# Patient Record
Sex: Female | Born: 1985 | Race: Black or African American | Hispanic: No | Marital: Single | Smoking: Former smoker
Health system: Southern US, Community
[De-identification: ages and names within clinical notes are randomized; demographics above are authoritative.]

## PROBLEM LIST (undated history)

## (undated) DIAGNOSIS — D649 Anemia, unspecified: Secondary | ICD-10-CM

## (undated) DIAGNOSIS — E669 Obesity, unspecified: Secondary | ICD-10-CM

## (undated) HISTORY — PX: APPENDECTOMY: SHX54

---

## 2004-10-08 ENCOUNTER — Emergency Department (HOSPITAL_COMMUNITY): Admission: EM | Admit: 2004-10-08 | Discharge: 2004-10-08 | Payer: Self-pay | Admitting: Emergency Medicine

## 2011-04-16 ENCOUNTER — Encounter: Payer: Self-pay | Admitting: Emergency Medicine

## 2011-04-16 ENCOUNTER — Emergency Department (HOSPITAL_BASED_OUTPATIENT_CLINIC_OR_DEPARTMENT_OTHER)
Admission: EM | Admit: 2011-04-16 | Discharge: 2011-04-16 | Disposition: A | Discharge status: Home / Self Care | Payer: Medicaid Other | Attending: Emergency Medicine | Admitting: Emergency Medicine

## 2011-04-16 DIAGNOSIS — J020 Streptococcal pharyngitis: Secondary | ICD-10-CM | POA: Insufficient documentation

## 2011-04-16 DIAGNOSIS — F17200 Nicotine dependence, unspecified, uncomplicated: Secondary | ICD-10-CM | POA: Insufficient documentation

## 2011-04-16 DIAGNOSIS — R042 Hemoptysis: Secondary | ICD-10-CM | POA: Insufficient documentation

## 2011-04-16 LAB — PREGNANCY, URINE: Preg Test, Ur: NEGATIVE

## 2011-04-16 LAB — RAPID STREP SCREEN (MED CTR MEBANE ONLY): Streptococcus, Group A Screen (Direct): POSITIVE — AB

## 2011-04-16 MED ORDER — PENICILLIN G BENZATHINE 1200000 UNIT/2ML IM SUSP
1.2000 10*6.[IU] | Freq: Once | INTRAMUSCULAR | Status: AC
  Administered 2011-04-16: 1.2 10*6.[IU] via INTRAMUSCULAR
  Filled 2011-04-16: qty 2

## 2011-04-16 NOTE — ED Notes (Signed)
Pt c/o sore throat x 1 week worsening tonight. Pt reports hemoptysis.

## 2011-04-16 NOTE — ED Provider Notes (Signed)
History     CSN: [id number]  Arrival date & time 04/16/11  4098   First MD Initiated Contact with Patient 04/16/11 [phone]      Chief Complaint  Patient presents with  . Sore Throat  . Hemoptysis    (Consider location/radiation/quality/duration/timing/severity/associated sxs/prior treatment) Patient is a 25 y.o. female presenting with pharyngitis. The history is provided by the patient. No language interpreter was used.  Sore Throat This is a recurrent problem. The current episode started more than 1 week ago. The problem occurs constantly. The problem has not changed since onset.Pertinent negatives include no chest pain, no abdominal pain, no headaches and no shortness of breath. The symptoms are aggravated by swallowing. The symptoms are relieved by nothing. She has tried nothing for the symptoms. The treatment provided no relief.  Had tonsillitis last year at the same time.  Thinks shes pregnant LMP 03/18/11  History reviewed. No pertinent past medical history.  Past Surgical History  Procedure Date  . Appendectomy     No family history on file.  History  Substance Use Topics  . Smoking status: Current Some Day Smoker  . Smokeless tobacco: Not on file  . Alcohol Use: Yes    OB History    Grav Para Term Preterm Abortions TAB SAB Ect Mult Living                  Review of Systems  Constitutional: Negative for fever, activity change and fatigue.  HENT: Positive for sore throat. Negative for sneezing and trouble swallowing.   Eyes: Negative for discharge.  Respiratory: Negative for shortness of breath.   Cardiovascular: Negative for chest pain.  Gastrointestinal: Negative for nausea, vomiting and abdominal pain.  Genitourinary: Negative for vaginal bleeding, vaginal discharge and difficulty urinating.  Musculoskeletal: Negative.   Skin: Negative.   Neurological: Negative for headaches.  Hematological: Negative for adenopathy.  Psychiatric/Behavioral: Negative.      Allergies  Review of patient's allergies indicates no known allergies.  Home Medications  No current outpatient prescriptions on file.  BP 129/73  Pulse 88  Temp(Src) 98.9 F (37.2 C) (Oral)  Resp 18  SpO2 99%  Physical Exam  Constitutional: She is oriented to person, place, and time. She appears well-developed and well-nourished. No distress.  HENT:  Head: Normocephalic and atraumatic.  Right Ear: Tympanic membrane is not injected.  Left Ear: Tympanic membrane is not injected.  Mouth/Throat: Uvula is midline. Oropharyngeal exudate and posterior oropharyngeal erythema present. No tonsillar abscesses.  Eyes: EOM are normal. Pupils are equal, round, and reactive to light.  Neck: Normal range of motion. Neck supple. No tracheal deviation present.  Cardiovascular: Normal rate and regular rhythm.   Pulmonary/Chest: Effort normal and breath sounds normal. She has no wheezes. She has no rales.  Abdominal: Soft. Bowel sounds are normal. She exhibits no mass. There is no tenderness. There is no rebound and no guarding.  Musculoskeletal: Normal range of motion.  Lymphadenopathy:    She has no cervical adenopathy.  Neurological: She is alert and oriented to person, place, and time.  Skin: Skin is warm and dry.  Psychiatric: Thought content normal.    ED Course  Procedures (including critical care time)  Labs Reviewed  RAPID STREP SCREEN - Abnormal; Notable for the following:    Streptococcus, Group A Screen (Direct) POSITIVE (*)    All other components within normal limits  PREGNANCY, URINE   No results found.   1. Strep pharyngitis  MDM  Follow up with ENT for consultation for tonsil removal.  If you are concerned you might be pregnant continue to check pregnancy tests at home or through your doctor because it may be too soon for a urine test to detect pregnancy. Patient verbalizes understanding and agrees to follow up       Jemia Fata Smitty Cords,  MD 04/16/11 [phone]

## 2013-11-22 ENCOUNTER — Emergency Department (HOSPITAL_COMMUNITY)
Admission: EM | Admit: 2013-11-22 | Discharge: 2013-11-22 | Disposition: A | Discharge status: Home / Self Care | Payer: Medicaid Other | Attending: Emergency Medicine | Admitting: Emergency Medicine

## 2013-11-22 ENCOUNTER — Encounter (HOSPITAL_COMMUNITY): Payer: Self-pay | Admitting: Emergency Medicine

## 2013-11-22 DIAGNOSIS — J020 Streptococcal pharyngitis: Secondary | ICD-10-CM | POA: Diagnosis not present

## 2013-11-22 DIAGNOSIS — J029 Acute pharyngitis, unspecified: Secondary | ICD-10-CM | POA: Diagnosis present

## 2013-11-22 DIAGNOSIS — J36 Peritonsillar abscess: Secondary | ICD-10-CM | POA: Diagnosis not present

## 2013-11-22 DIAGNOSIS — Z87891 Personal history of nicotine dependence: Secondary | ICD-10-CM | POA: Diagnosis not present

## 2013-11-22 LAB — BASIC METABOLIC PANEL
Anion gap: 14 (ref 5–15)
BUN: 9 mg/dL (ref 6–23)
CALCIUM: 8.8 mg/dL (ref 8.4–10.5)
CO2: 24 meq/L (ref 19–32)
Chloride: 103 mEq/L (ref 96–112)
Creatinine, Ser: 0.76 mg/dL (ref 0.50–1.10)
GFR calc Af Amer: >90 mL/min (ref >90)
GFR calc non Af Amer: >90 mL/min (ref >90)
Glucose, Bld: 89 mg/dL (ref 70–99)
POTASSIUM: 3.6 meq/L — AB (ref 3.7–5.3)
Sodium: 141 mEq/L (ref 137–147)

## 2013-11-22 LAB — CBC WITH DIFFERENTIAL/PLATELET
BASOS ABS: 0 10*3/uL (ref 0.0–0.1)
Basophils Relative: 0 % (ref 0–1)
Eosinophils Absolute: 0.1 10*3/uL (ref 0.0–0.7)
Eosinophils Relative: 1 % (ref 0–5)
HEMATOCRIT: 35 % — AB (ref 36.0–46.0)
HEMOGLOBIN: 10.9 g/dL — AB (ref 12.0–15.0)
LYMPHS ABS: 2.9 10*3/uL (ref 0.7–4.0)
Lymphocytes Relative: 27 % (ref 12–46)
MCH: 25.4 pg — ABNORMAL LOW (ref 26.0–34.0)
MCHC: 31.1 g/dL (ref 30.0–36.0)
MCV: 81.6 fL (ref 78.0–100.0)
MONO ABS: 0.6 10*3/uL (ref 0.1–1.0)
MONOS PCT: 5 % (ref 3–12)
Neutro Abs: 7.3 10*3/uL (ref 1.7–7.7)
Neutrophils Relative %: 67 % (ref 43–77)
Platelets: 267 10*3/uL (ref 150–400)
RBC: 4.29 MIL/uL (ref 3.87–5.11)
RDW: 15 % (ref 11.5–15.5)
WBC: 11 10*3/uL — AB (ref 4.0–10.5)

## 2013-11-22 LAB — RAPID STREP SCREEN (MED CTR MEBANE ONLY): STREPTOCOCCUS, GROUP A SCREEN (DIRECT): POSITIVE — AB

## 2013-11-22 MED ORDER — DEXAMETHASONE SODIUM PHOSPHATE 10 MG/ML IJ SOLN
8.0000 mg | Freq: Once | INTRAMUSCULAR | Status: AC
  Administered 2013-11-22: 8 mg via INTRAVENOUS
  Filled 2013-11-22: qty 1

## 2013-11-22 MED ORDER — BENZOCAINE 20 % MT SOLN
Freq: Once | OROMUCOSAL | Status: AC
  Administered 2013-11-22: 1 via OROMUCOSAL
  Filled 2013-11-22: qty 57

## 2013-11-22 MED ORDER — SODIUM CHLORIDE 0.9 % IV BOLUS (SEPSIS)
1000.0000 mL | Freq: Once | INTRAVENOUS | Status: AC
  Administered 2013-11-22: 1000 mL via INTRAVENOUS

## 2013-11-22 MED ORDER — LIDOCAINE HCL (PF) 1 % IJ SOLN
3.0000 mL | Freq: Once | INTRAMUSCULAR | Status: AC
  Administered 2013-11-22 (×2): 3 mL
  Filled 2013-11-22: qty 5

## 2013-11-22 MED ORDER — FENTANYL CITRATE 0.05 MG/ML IJ SOLN
75.0000 ug | Freq: Once | INTRAMUSCULAR | Status: AC
  Administered 2013-11-22: 75 ug via INTRAVENOUS
  Filled 2013-11-22: qty 2

## 2013-11-22 MED ORDER — CLINDAMYCIN PHOSPHATE 600 MG/50ML IV SOLN
600.0000 mg | Freq: Once | INTRAVENOUS | Status: AC
  Administered 2013-11-22: 600 mg via INTRAVENOUS
  Filled 2013-11-22: qty 50

## 2013-11-22 MED ORDER — CLINDAMYCIN HCL 150 MG PO CAPS: 300.0000 mg | ORAL_CAPSULE | Freq: Three times a day (TID) | ORAL | Status: DC

## 2013-11-22 MED ORDER — LIDOCAINE HCL (PF) 1 % IJ SOLN
INTRAMUSCULAR | Status: AC
Start: 2013-11-22 — End: 2013-11-22
  Administered 2013-11-22: 3 mL
  Filled 2013-11-22: qty 5

## 2013-11-22 NOTE — ED Notes (Signed)
CT scan note: Exam on hold, patient to have another test in its place. RN will call if still needed

## 2013-11-22 NOTE — ED Notes (Signed)
Pt alert and oriented at discharge.  Pt ambulated with steady gait with the RN to the waiting room.

## 2013-11-22 NOTE — Discharge Instructions (Signed)
If you were given medicines take as directed.  If you are on coumadin or contraceptives realize their levels and effectiveness is altered by many different medicines.  If you have any reaction (rash, tongues swelling, other) to the medicines stop taking and see a physician.   Please follow up as directed and return to the ER or see a physician for new or worsening symptoms.  Thank you. Filed Vitals:   11/22/13 0830 11/22/13 0845 11/22/13 0847 11/22/13 0907  BP: 131/66 117/49  133/70  Pulse: 68 59  56  Temp:      TempSrc:      Resp:    18  Height:      Weight:      SpO2: 100% 100% 100% 100%

## 2013-11-22 NOTE — ED Notes (Signed)
Pt c/o of sore throat that has been ongoing since Monday.  Pt tonsils on assessment are swollen and red.

## 2013-11-22 NOTE — Progress Notes (Signed)
Second Lidocaine neb administered per MD to prep for procedure. Pt tolerating well. RT will continue to monitor.

## 2013-11-22 NOTE — ED Provider Notes (Signed)
CSN: [id number]     Arrival date & time 11/22/13  0504 History   First MD Initiated Contact with Patient 11/22/13 0630     Chief Complaint  Patient presents with  . Sore Throat     (Consider location/radiation/quality/duration/timing/severity/associated sxs/prior Treatment) HPI Comments: 28 year old female with past smoking history, appendectomy, otherwise healthy with no immunosuppression history presents with worsening sore throat since Monday. Patient was seen at outside hospital given Decadron and antibiotic prescription for which he cannot afford to fill. Patient's had worsening symptoms since and mild voice change. No abscess history. Pain with swallowing and decreased oral intake.  Patient is a 28 y.o. female presenting with pharyngitis. The history is provided by the patient.  Sore Throat Pertinent negatives include no chest pain, no abdominal pain, no headaches and no shortness of breath.    History reviewed. No pertinent past medical history. Past Surgical History  Procedure Laterality Date  . Appendectomy     No family history on file. History  Substance Use Topics  . Smoking status: Former Games developer  . Smokeless tobacco: Not on file  . Alcohol Use: No   OB History   Grav Para Term Preterm Abortions TAB SAB Ect Mult Living                 Review of Systems  Constitutional: Positive for appetite change. Negative for fever and chills.  HENT: Positive for sore throat. Negative for congestion.   Eyes: Negative for visual disturbance.  Respiratory: Negative for shortness of breath.   Cardiovascular: Negative for chest pain.  Gastrointestinal: Negative for vomiting and abdominal pain.  Genitourinary: Negative for dysuria and flank pain.  Musculoskeletal: Negative for back pain, neck pain and neck stiffness.  Skin: Negative for rash.  Neurological: Negative for light-headedness and headaches.      Allergies  Review of patient's allergies indicates no known  allergies.  Home Medications   Prior to Admission medications   Medication Sig Start Date End Date Taking? Authorizing Provider  naproxen sodium (ANAPROX) 220 MG tablet Take 220 mg by mouth every 8 (eight) hours as needed (pain).   Yes Historical Provider, MD   BP 133/70  Pulse 56  Temp(Src) 98.7 F (37.1 C) (Oral)  Resp 18  Ht 5\' 4"  (1.626 m)  Wt 245 lb (111.131 kg)  BMI 42.03 kg/m2  SpO2 100%  LMP 11/09/2013 Physical Exam  Nursing note and vitals reviewed. Constitutional: She is oriented to person, place, and time. She appears well-developed and well-nourished.  HENT:  Head: Normocephalic and atraumatic.  Patient has mild erythema bilateral posterior pharynx with prominent/bulging right supratonsillar region, mild change in voice, no stridor, no swelling submandibularly, neck supple, no difficulty with speech.  Eyes: Conjunctivae are normal. Right eye exhibits no discharge. Left eye exhibits no discharge.  Neck: Normal range of motion. Neck supple. No tracheal deviation present.  Cardiovascular: Normal rate and regular rhythm.   Pulmonary/Chest: Effort normal and breath sounds normal.  Abdominal: Soft. She exhibits no distension. There is no tenderness. There is no guarding.  Musculoskeletal: She exhibits no edema.  Neurological: She is alert and oriented to person, place, and time.  Skin: Skin is warm. No rash noted.  Psychiatric: She has a normal mood and affect.    ED Course  Procedures (including critical care time) Peritonsillar abscess drainage INCISION AND DRAINAGE Performed by: Enid Skeens Consent: Verbal consent obtained. Risks and benefits: risks, benefits and alternatives were discussed Type: Peritonsillar abscess  Body area: Right  posterior pharynx  Anesthesia: Lidocaine nebulizer, local Hurricaine spray Incision was made with 18-gauge needle with 1 cm safety cut off the plastic covering.  Local anesthetic: lidocaine Anesthetic total: 6  ml Complexity:  BComplex blunt dissection to break up loculations Drainage: 3 cc purulent drainage   Patient tolerance: Patient tolerated the procedure well with no immediate complications.  EMERGENCY DEPARTMENT US SOFT TISSUE INTERPRETATION "Study: Limited Soft Tissue Ultrasound"  INDICATIONS: Pain and Soft tissue infection Multiple views of the body part were obtained in real-time with a multi-frequency linear probe PERFORMED BY:  Myself IMAGES ARCHIVED?: Yes SIDE:Right  BODY PART:Neck posterior pharynx PTA FINDINGS: Abcess present INTERPRETATION:  Abcess present    Labs Review Labs Reviewed  RAPID STREP SCREEN - Abnormal; Notable for the following:    Streptococcus, Group A Screen (Direct) POSITIVE (*)    All other components within normal limits  CBC WITH DIFFERENTIAL - Abnormal; Notable for the following:    WBC 11.0 (*)    Hemoglobin 10.9 (*)    HCT 35.0 (*)    MCH 25.4 (*)    All other components within normal limits  BASIC METABOLIC PANEL - Abnormal; Notable for the following:    Potassium 3.6 (*)    All other components within normal limits  CULTURE, ROUTINE-ABSCESS    Imaging Review No results found.   EKG Interpretation None      MDM   Final diagnoses:  Peritonsillar abscess  Strep pharyngitis   Clinically peritonsillar abscess. Discussed options including CT versus bedside ultrasound, patient has financial issues and prefers bedside ultrasound and attempted drainage at bedside. Patient giving lidocaine neb and Hurricaine spray at bedside, initially didn't tolerate well as, second lidocaine neb given and patient tolerated well for ultrasound showing abscess followed by 18-gauge needle drainage of 3 cc of pus. Patient felt improved after procedure. Plan for followup with ENT and primary Dr. as needed. Antibiotics for home. Patient received IV antibiotics and fluids in ER. Discussed risks and benefits.  Pt tolerated procedure well, fup outpt  discused Results and differential diagnosis were discussed with the patient/parent/guardian. Close follow up outpatient was discussed, comfortable with the plan.   Medications  clindamycin (CLEOCIN) IVPB 600 mg (600 mg Intravenous New Bag/Given 11/22/13 0804)  lidocaine (PF) (XYLOCAINE) 1 % injection 3 mL (3 mLs Other Given 11/22/13 0846)  sodium chloride 0.9 % bolus 1,000 mL (1,000 mLs Intravenous New Bag/Given 11/22/13 0741)  dexamethasone (DECADRON) injection 8 mg (8 mg Intravenous Given 11/22/13 0746)  fentaNYL (SUBLIMAZE) injection 75 mcg (75 mcg Intravenous Given 11/22/13 0741)  benzocaine (HURRICAINE) 20 % mouth spray (1 application Mouth/Throat Given 11/22/13 0748)    Filed Vitals:   11/22/13 0830 11/22/13 0845 11/22/13 0847 11/22/13 0907  BP: 131/66 117/49  133/70  Pulse: 68 59  56  Temp:      TempSrc:      Resp:    18  Height:      Weight:      SpO2: 100% 100% 100% 100%        Enid SkeensJoshua M Abdulrahman Bracey, MD 11/22/13 [phone]

## 2013-11-24 ENCOUNTER — Telehealth (HOSPITAL_COMMUNITY): Payer: Self-pay

## 2013-11-24 LAB — CULTURE, ROUTINE-ABSCESS

## 2013-11-24 NOTE — ED Notes (Signed)
Post ED Visit - Positive Culture Follow-up  Culture report reviewed by antimicrobial stewardship pharmacist: []  Wes Dulaney, Pharm.D., BCPS []  Celedonio MiyamotoJeremy Frens, Pharm.D., BCPS []  Georgina PillionElizabeth Martin, Pharm.D., BCPS []  MirandaMinh Pham, [address] BoulevardPharm.D., BCPS, AAHIVP []  Estella HuskMichelle Turner, Pharm.D., BCPS, AAHIVP []  Red ChristiansSamson Lee, Pharm.D. [x]  Tennis Mustassie Stewart, Pharm.D.  Positive abscess culture Treated with clindamycin , organism sensitive to the same and no further patient follow-up is required at this time.  Ashley JacobsFesterman, Bless Lisenby C 11/24/2013, 1:51 PM

## 2013-11-25 ENCOUNTER — Telehealth (HOSPITAL_BASED_OUTPATIENT_CLINIC_OR_DEPARTMENT_OTHER): Payer: Self-pay | Admitting: Emergency Medicine

## 2013-11-25 NOTE — Telephone Encounter (Signed)
Post ED Visit - Positive Culture Follow-up  Culture report reviewed by antimicrobial stewardship pharmacist: []  Katherine Lowe, Pharm.D., BCPS []  Katherine Lowe, [address]Pharm[address]D., BCPS []  Katherine Lowe, Pharm.D., BCPS []  Gil[place] CityMinh Lowe, [place]arm[place]D., BCPS, AAHIVP []  Katherine Lowe, Pharm.D., BCPS, AAHIVP [x]  Katherine Lowe, Pharm.D. []  Katherine Lowe, Pharm.D.  Positive abcess culture abundant group A strep isolated Treated with Clindamycin HCL 150 mg po 2 capsules(300 mg) by mouth tid x 7 days, organism sensitive to the same and no further patient follow-up is required at this time.  Katherine Lowe, Katherine Lowe 11/25/2013, 11:15 AM

## 2013-12-02 ENCOUNTER — Emergency Department (HOSPITAL_COMMUNITY): Payer: Medicaid Other

## 2013-12-02 ENCOUNTER — Encounter (HOSPITAL_COMMUNITY): Payer: Self-pay | Admitting: Emergency Medicine

## 2013-12-02 ENCOUNTER — Emergency Department (HOSPITAL_COMMUNITY)
Admission: EM | Admit: 2013-12-02 | Discharge: 2013-12-02 | Disposition: A | Discharge status: Home / Self Care | Payer: Medicaid Other | Attending: Emergency Medicine | Admitting: Emergency Medicine

## 2013-12-02 DIAGNOSIS — Z87891 Personal history of nicotine dependence: Secondary | ICD-10-CM | POA: Insufficient documentation

## 2013-12-02 DIAGNOSIS — J029 Acute pharyngitis, unspecified: Secondary | ICD-10-CM | POA: Diagnosis present

## 2013-12-02 DIAGNOSIS — J36 Peritonsillar abscess: Secondary | ICD-10-CM | POA: Diagnosis not present

## 2013-12-02 LAB — BASIC METABOLIC PANEL
ANION GAP: 12 (ref 5–15)
BUN: 10 mg/dL (ref 6–23)
CALCIUM: 9.2 mg/dL (ref 8.4–10.5)
CO2: 23 meq/L (ref 19–32)
CREATININE: 0.68 mg/dL (ref 0.50–1.10)
Chloride: 105 mEq/L (ref 96–112)
GFR calc Af Amer: >90 mL/min (ref >90)
Glucose, Bld: 95 mg/dL (ref 70–99)
POTASSIUM: 4.1 meq/L (ref 3.7–5.3)
Sodium: 140 mEq/L (ref 137–147)

## 2013-12-02 LAB — CBC WITH DIFFERENTIAL/PLATELET
BASOS ABS: 0 10*3/uL (ref 0.0–0.1)
BASOS PCT: 0 % (ref 0–1)
EOS ABS: 0.1 10*3/uL (ref 0.0–0.7)
EOS PCT: 0 % (ref 0–5)
HEMATOCRIT: 35.5 % — AB (ref 36.0–46.0)
Hemoglobin: 11.5 g/dL — ABNORMAL LOW (ref 12.0–15.0)
LYMPHS ABS: 1.9 10*3/uL (ref 0.7–4.0)
LYMPHS PCT: 12 % (ref 12–46)
MCH: 25.6 pg — AB (ref 26.0–34.0)
MCHC: 32.4 g/dL (ref 30.0–36.0)
MCV: 78.9 fL (ref 78.0–100.0)
MONOS PCT: 5 % (ref 3–12)
Monocytes Absolute: 0.8 10*3/uL (ref 0.1–1.0)
NEUTROS PCT: 83 % — AB (ref 43–77)
Neutro Abs: 12.8 10*3/uL — ABNORMAL HIGH (ref 1.7–7.7)
PLATELETS: 302 10*3/uL (ref 150–400)
RBC: 4.5 MIL/uL (ref 3.87–5.11)
RDW: 15.1 % (ref 11.5–15.5)
WBC: 15.5 10*3/uL — ABNORMAL HIGH (ref 4.0–10.5)

## 2013-12-02 MED ORDER — ONDANSETRON HCL 4 MG/2ML IJ SOLN
4.0000 mg | Freq: Once | INTRAMUSCULAR | Status: AC
  Administered 2013-12-02: 4 mg via INTRAVENOUS
  Filled 2013-12-02: qty 2

## 2013-12-02 MED ORDER — CLINDAMYCIN PHOSPHATE 900 MG/50ML IV SOLN
900.0000 mg | Freq: Once | INTRAVENOUS | Status: AC
  Administered 2013-12-02: 900 mg via INTRAVENOUS
  Filled 2013-12-02: qty 50

## 2013-12-02 MED ORDER — CLINDAMYCIN HCL 150 MG PO CAPS: 150.0000 mg | ORAL_CAPSULE | Freq: Four times a day (QID) | ORAL | Status: DC

## 2013-12-02 MED ORDER — IOHEXOL 300 MG/ML  SOLN
75.0000 mL | Freq: Once | INTRAMUSCULAR | Status: AC | PRN
  Administered 2013-12-02: 75 mL via INTRAVENOUS

## 2013-12-02 MED ORDER — DEXAMETHASONE SODIUM PHOSPHATE 10 MG/ML IJ SOLN
20.0000 mg | Freq: Once | INTRAMUSCULAR | Status: AC
  Administered 2013-12-02: 20 mg via INTRAVENOUS
  Filled 2013-12-02: qty 2

## 2013-12-02 MED ORDER — MORPHINE SULFATE 4 MG/ML IJ SOLN
4.0000 mg | Freq: Once | INTRAMUSCULAR | Status: AC
  Administered 2013-12-02: 4 mg via INTRAVENOUS
  Filled 2013-12-02: qty 1

## 2013-12-02 MED ORDER — HYDROCODONE-ACETAMINOPHEN 5-325 MG PO TABS: 1.0000 | ORAL_TABLET | ORAL | Status: DC | PRN

## 2013-12-02 MED ORDER — FENTANYL CITRATE 0.05 MG/ML IJ SOLN
50.0000 ug | Freq: Once | INTRAMUSCULAR | Status: AC
  Administered 2013-12-02: 50 ug via INTRAVENOUS
  Filled 2013-12-02: qty 2

## 2013-12-02 NOTE — ED Provider Notes (Signed)
Medical screening examination/treatment/procedure(s) were performed by non-physician practitioner and as supervising physician I was immediately available for consultation/collaboration.   EKG Interpretation None       Olivia Mackielga M Zarielle Cea, MD 12/02/13 2103

## 2013-12-02 NOTE — ED Provider Notes (Signed)
CSN: [id number]     Arrival date & time 12/02/13  0446 History   First MD Initiated Contact with Patient 12/02/13 [phone]     Chief Complaint  Patient presents with  . Oral Swelling  . Sore Throat     (Consider location/radiation/quality/duration/timing/severity/associated sxs/prior Treatment) HPI 28 year old female with no past medical history who presents with one day of throat pain and swelling. Patient was seen for similar symptoms approximately 10 days ago and diagnosed with peritonsillar abscess. Patient's abscess was drained at bedside in the ED, and patient was treated with IV antibiotics in the ED and encouraged to follow up with ENT.patient states today that she did not have money to fill her prescription antibiotics, and and was unable to followup with ENT. She states she began having similar symptoms yesterday with a gradual onset of throat pain over the last 12 hours, dysphagia, and came in for reevaluation today. She describes the pain as being constant, is worse with swallowing, has no aggravating or alleviating factors.  She denies fever, nausea, vomiting, shortness of breath, dizziness, chest pain.  History reviewed. No pertinent past medical history. Past Surgical History  Procedure Laterality Date  . Appendectomy     History reviewed. No pertinent family history. History  Substance Use Topics  . Smoking status: Former Games developer  . Smokeless tobacco: Not on file  . Alcohol Use: No   OB History   Grav Para Term Preterm Abortions TAB SAB Ect Mult Living                 Review of Systems  Constitutional: Negative for fever and chills.  HENT: Positive for sore throat, trouble swallowing and voice change. Negative for drooling and hearing loss.   Eyes: Negative for visual disturbance.  Respiratory: Negative for shortness of breath, wheezing and stridor.   Cardiovascular: Negative for chest pain.  Gastrointestinal: Negative for nausea, vomiting, abdominal pain and diarrhea.   Genitourinary: Negative for dysuria.  Musculoskeletal: Negative for arthralgias.  Skin: Negative for rash.  Allergic/Immunologic: Negative for immunocompromised state.  Neurological: Negative for dizziness, syncope, weakness and numbness.  Psychiatric/Behavioral: Negative.       Allergies  Review of patient's allergies indicates no known allergies.  Home Medications   Prior to Admission medications   Medication Sig Start Date End Date Taking? Authorizing Provider  acetaminophen (TYLENOL) 325 MG tablet Take 650 mg by mouth every 6 (six) hours as needed for mild pain.   Yes Historical Provider, MD  naproxen sodium (ANAPROX) 220 MG tablet Take 220 mg by mouth every 8 (eight) hours as needed (pain).   Yes Historical Provider, MD  clindamycin (CLEOCIN) 150 MG capsule Take 2 capsules (300 mg total) by mouth 3 (three) times daily. 11/22/13   Enid Skeens, MD  clindamycin (CLEOCIN) 150 MG capsule Take 1 capsule (150 mg total) by mouth every 6 (six) hours. 12/02/13   Rolland Porter, MD  HYDROcodone-acetaminophen (NORCO/VICODIN) 5-325 MG per tablet Take 1 tablet by mouth every 4 (four) hours as needed. 12/02/13   Rolland Porter, MD   BP 127/68  Pulse 70  Temp(Src) 98.7 F (37.1 C) (Oral)  Resp 18  Ht 5\' 4"  (1.626 m)  Wt 245 lb (111.131 kg)  BMI 42.03 kg/m2  SpO2 96%  LMP 11/09/2013 Physical Exam  Constitutional: She is oriented to person, place, and time. She appears well-developed and well-nourished. No distress.  HENT:  Head: Normocephalic and atraumatic.  Right Ear: Tympanic membrane normal.  Left Ear: Tympanic membrane  normal.  Mouth/Throat: No trismus in the jaw. No uvula swelling. Posterior oropharyngeal edema, posterior oropharyngeal erythema and tonsillar abscesses present. No oropharyngeal exudate.  No trismus noted on exam. Swelling noted to right tonsil to the extent that it is touching the left tonsil. No obvious exudate noted. No stridor noted  Neck: Normal range of motion.  Tracheal tenderness present.  Cardiovascular: Normal rate, regular rhythm, S1 normal and S2 normal.   Pulmonary/Chest: Effort normal and breath sounds normal. No accessory muscle usage or stridor. Not tachypneic. No respiratory distress.  Abdominal: Normal appearance. There is no tenderness.  Neurological: She is alert and oriented to person, place, and time. She has normal strength. No cranial nerve deficit or sensory deficit.  Skin: She is not diaphoretic.  Psychiatric: She has a normal mood and affect.    ED Course  Procedures (including critical care time) Labs Review Labs Reviewed  CBC WITH DIFFERENTIAL - Abnormal; Notable for the following:    WBC 15.5 (*)    Hemoglobin 11.5 (*)    HCT 35.5 (*)    MCH 25.6 (*)    Neutrophils Relative % 83 (*)    Neutro Abs 12.8 (*)    All other components within normal limits  BASIC METABOLIC PANEL    Imaging Review Ct Soft Tissue Neck W Contrast  12/02/2013   CLINICAL DATA:  History of recent peritonsillar abscess treated with surgical drainage but no antibiotics  EXAM: CT NECK WITH CONTRAST  TECHNIQUE: Multidetector CT imaging of the neck was performed using the standard protocol following the bolus administration of intravenous contrast.  CONTRAST:  75mL OMNIPAQUE IOHEXOL 300 MG/ML  SOLN intravenously  COMPARISON:  None.  FINDINGS: There is a 2.2 x 2.4 x 2.4 cm low-density region in the right parapharyngeal space with surrounding enhancement consistent with peritonsillar abscess. This is producing significant mass effect upon the posterior nasopharygeal and upper oropharyngeal airway. There is no abnormality on the right. The prevertebral soft tissue spaces are normal. There are enlarged anterior cervical lymph nodes.  The parotid and submandibular glands are unremarkable. The paranasal sinuses exhibit no acute inflammatory changes. The carotid and jugular vessels are normal. The laryngeal structures and the thyroid gland are unremarkable. The  pulmonary apices are clear. There is reversal of the normal cervical lordosis consistent with muscle spasm.  IMPRESSION: There is a large right-sided peritonsillar abscess producing some compromise of the nasopharygeal and upper oropharyngeal airways. There is no significant inflammatory change elsewhere in the neck.  These results were called by telephone at the time of interpretation on 12/02/2013 at 8:21 am to [facility] CenterJOSEPH Lasonja Lakins, PA, who verbally acknowledged these results.   Electronically Signed   By: David  [place]   On: 12/02/2013 08:22     EKG Interpretation None      MDM   Final diagnoses:  Peritonsillar abscess    28 year old female treated 10 days ago for PTA in the ED with IV antibiotics and bedside drainage. Today back with the same symptoms of sore throat, dysphagia. Patient has swelling of right tonsil with uvula deviation. No fever, chills, nausea, vomiting, shortness of breath, stridor, chest pain.    10:00 AM: Patient states final did not help her pain. Will try morphine 4 mg for pain management.  ENT consulted. Dr.Teoh spoken with and recommends giving patient clindamycin, Decadron and wishes to see patient in the ED to drain abscess. Patient complaining of having oral secretions, we have her suction as needed for secretions.  11:00 AM: Dr.  Teoh seen patient in the ED and performed I&D on peritonsillar abscess. Dr. Suszanne Conners recommends by mouth clindamycin 150 3 times a day for 10 days and close followup with ENT. We discussed with patient that clindamycin will be available on [facility] $4 list and strongly encouraged patient to fill this prescription and follow up with ENT closely. We encouraged patient to call or return to the ER should she have any questions or concerns.   Signed,  Ladona Mow, PA-C 6:20 PM   .  Monte Fantasia, PA-C 12/02/13 Zollie Pee

## 2013-12-02 NOTE — Discharge Instructions (Signed)
Peritonsillar Abscess Peritonsillar abscess is a collection of yellowish white fluid (pus) in the back of the throat. This fluid forms behind the tonsils. The treatment is most often drainage. This is done by:  Putting a needle into the abscess.  Cutting and draining the abscess. HOME CARE  If your abscess was drained today:  Mix 1 teaspoon of salt in 8 ounces of warm water for gargling.  Gargle the warm salt water.  Gargle 4 times per day or as needed for comfort. Do not swallow this mixture.  Rest in bed as needed.  Return to normal activity as soon as you can.  Apply cold to your neck for pain relief.  Put ice in a plastic bag.  Place a towel between your skin and the bag.  Leave the ice on for 15-20 minutes at a time, 03-04 times a day.  Eat a soft or liquid diet. Drink cold fluids. Cold fluids will soothe and take puffiness (swelling) down.  Only take medicine as told by your doctor.  Take all medicine as told. GET HELP RIGHT AWAY IF:   You are coughing up or throwing up (vomiting) blood.  Your throat pain is severe and pain medicine does not help it.  You have trouble talking or breathing.  You have trouble swallowing or eating.  You find it easier to breathe while leaning forward.  You have pain that gets worse.  You have pain, puffiness, redness, or drainage in your throat.  You have a fever.  You become dizzy, have a headache, have low energy, or feel sick.  You have signs of body fluid loss (dehydration). This includes lightheadedness when standing, peeing (urinating) less, a fast heart rate, or dry mouth and nose.  You start to drool. MAKE SURE YOU:  Understand these instructions.  Will watch your condition.  Will get help if you are not doing well or get worse. Document Released: 03/22/2009 Document Revised: 06/26/2011 Document Reviewed: 03/22/2009 [facility] Patient Information 2015 [place], [place]. This information is not intended to replace  advice given to you by your health care provider. Make sure you discuss any questions you have with your health care provider.  Peritonsillar Abscess A peritonsillar abscess is a collection of pus located in the back of the throat behind the tonsils. It usually occurs when a streptococcal infection of the throat or tonsils spreads into the space around the tonsils. They are almost always caused by the streptococcal germ (bacteria). The treatment of a peritonsillar abscess is most often drainage accomplished by putting a needle into the abscess or cutting (incising) and draining the abscess. This is most often followed with a course of antibiotics. HOME CARE INSTRUCTIONS  If your abscess was drained by your caregiver today, rinse your throat (gargle) with warm salt water four times per day or as needed for comfort. Do not swallow this mixture. Mix 1 teaspoon of salt in 8 ounces of warm water for gargling.  Rest in bed as needed. Resume activities as able.  Apply cold to your neck for pain relief. Fill a plastic bag with ice and wrap it in a towel. Hold the ice on your neck for 20 minutes 4 times per day.  Eat a soft or liquid diet as tolerated while your throat remains sore. Popsicles and ice cream may be good early choices. Drinking plenty of cold fluids will probably be soothing and help take swelling down in between the warm gargles.  Only take over-the-counter or prescription medicines for  pain, discomfort, or fever as directed by your caregiver. Do not use aspirin unless directed by your physician. Aspirin slows down the clotting process. It can also cause bleeding from the drainage area if this was needled or incised today.  If antibiotics were prescribed, take them as directed for the full course of the prescription. Even if you feel you are well, you need to take them. SEEK MEDICAL CARE IF:   You have increased pain, swelling, redness, or drainage in your throat.  You develop signs of  infection such as dizziness, headache, lethargy, or generalized feelings of illness.  You have difficulty breathing, swallowing or eating.  You show signs of becoming dehydrated (lightheadedness when standing, decreased urine output, a fast heart rate, or dry mouth and mucous membranes). SEEK IMMEDIATE MEDICAL CARE IF:   You have a fever.  You are coughing up or vomiting blood.  You develop more severe throat pain uncontrolled with medicines or you start to drool.  You develop difficulty breathing, talking, or find it easier to breathe while leaning forward. Document Released: 04/03/2005 Document Revised: 06/26/2011 Document Reviewed: 11/15/2007 [facility] Patient Information 2015 [place], [place]. This information is not intended to replace advice given to you by your health care provider. Make sure you discuss any questions you have with your health care provider.

## 2013-12-02 NOTE — Consult Note (Signed)
Reason for Consult: Right peritonsillar abscess Referring Physician: ER MD  HPI:  Katherine Lowe is a 28 year old female with no past medical history who presents with throat pain and swelling. Patient was seen for similar symptoms approximately 10 days ago and diagnosed with peritonsillar abscess. Patient's abscess was drained at bedside in the ED by ER provider, and patient was treated with IV antibiotics in the ED and encouraged to follow up with ENT.patient states today that she did not have money to fill her prescription antibiotics, and and did not followup with ENT. She states she began having similar symptoms yesterday, and came in for reevaluation today. She denies fever, nausea, vomiting, shortness of breath, dizziness, chest pain. CT shows right PTA   History reviewed. No pertinent past medical history.  Past Surgical History  Procedure Laterality Date  . Appendectomy      History reviewed. No pertinent family history.  Social History:  reports that she has quit smoking. She does not have any smokeless tobacco history on file. She reports that she does not drink alcohol. Her drug history is not on file.  Allergies: No Known Allergies  Prior to Admission medications   Medication Sig Start Date End Date Taking? Authorizing Provider  acetaminophen (TYLENOL) 325 MG tablet Take 650 mg by mouth every 6 (six) hours as needed for mild pain.   Yes Historical Provider, MD  naproxen sodium (ANAPROX) 220 MG tablet Take 220 mg by mouth every 8 (eight) hours as needed (pain).   Yes Historical Provider, MD  clindamycin (CLEOCIN) 150 MG capsule Take 2 capsules (300 mg total) by mouth 3 (three) times daily. 11/22/13   Mariea Clonts, MD  clindamycin (CLEOCIN) 150 MG capsule Take 1 capsule (150 mg total) by mouth every 6 (six) hours. 12/02/13   Tanna Furry, MD  HYDROcodone-acetaminophen (NORCO/VICODIN) 5-325 MG per tablet Take 1 tablet by mouth every 4 (four) hours as needed. 12/02/13   Tanna Furry, MD     Medications: I have reviewed the patient's current medications. Scheduled:  Results for orders placed during the hospital encounter of 12/02/13 (from the past 48 hour(s))  CBC WITH DIFFERENTIAL     Status: Abnormal   Collection Time    12/02/13  6:36 AM      Result Value Ref Range   WBC 15.5 (*) 4.0 - 10.5 K/uL   RBC 4.50  3.87 - 5.11 MIL/uL   Hemoglobin 11.5 (*) 12.0 - 15.0 g/dL   HCT 35.5 (*) 36.0 - 46.0 %   MCV 78.9  78.0 - 100.0 fL   MCH 25.6 (*) 26.0 - 34.0 pg   MCHC 32.4  30.0 - 36.0 g/dL   RDW 15.1  11.5 - 15.5 %   Platelets 302  150 - 400 K/uL   Neutrophils Relative % 83 (*) 43 - 77 %   Neutro Abs 12.8 (*) 1.7 - 7.7 K/uL   Lymphocytes Relative 12  12 - 46 %   Lymphs Abs 1.9  0.7 - 4.0 K/uL   Monocytes Relative 5  3 - 12 %   Monocytes Absolute 0.8  0.1 - 1.0 K/uL   Eosinophils Relative 0  0 - 5 %   Eosinophils Absolute 0.1  0.0 - 0.7 K/uL   Basophils Relative 0  0 - 1 %   Basophils Absolute 0.0  0.0 - 0.1 K/uL  BASIC METABOLIC PANEL     Status: None   Collection Time    12/02/13  6:36 AM  Result Value Ref Range   Sodium 140  137 - 147 mEq/L   Potassium 4.1  3.7 - 5.3 mEq/L   Chloride 105  96 - 112 mEq/L   CO2 23  19 - 32 mEq/L   Glucose, Bld 95  70 - 99 mg/dL   BUN 10  6 - 23 mg/dL   Creatinine, Ser 0.68  0.50 - 1.10 mg/dL   Calcium 9.2  8.4 - 10.5 mg/dL   GFR calc non Af Amer >90  >90 mL/min   GFR calc Af Amer >90  >90 mL/min   Comment: (NOTE)     The eGFR has been calculated using the CKD EPI equation.     This calculation has not been validated in all clinical situations.     eGFR's persistently <90 mL/min signify possible Chronic Kidney     Disease.   Anion gap 12  5 - 15    Ct Soft Tissue Neck W Contrast  12/02/2013   CLINICAL DATA:  History of recent peritonsillar abscess treated with surgical drainage but no antibiotics  EXAM: CT NECK WITH CONTRAST  TECHNIQUE: Multidetector CT imaging of the neck was performed using the standard protocol  following the bolus administration of intravenous contrast.  CONTRAST:  73m OMNIPAQUE IOHEXOL 300 MG/ML  SOLN intravenously  COMPARISON:  None.  FINDINGS: There is a 2.2 x 2.4 x 2.4 cm low-density region in the right parapharyngeal space with surrounding enhancement consistent with peritonsillar abscess. This is producing significant mass effect upon the posterior nasopharygeal and upper oropharyngeal airway. There is no abnormality on the right. The prevertebral soft tissue spaces are normal. There are enlarged anterior cervical lymph nodes.  The parotid and submandibular glands are unremarkable. The paranasal sinuses exhibit no acute inflammatory changes. The carotid and jugular vessels are normal. The laryngeal structures and the thyroid gland are unremarkable. The pulmonary apices are clear. There is reversal of the normal cervical lordosis consistent with muscle spasm.  IMPRESSION: There is a large right-sided peritonsillar abscess producing some compromise of the nasopharygeal and upper oropharyngeal airways. There is no significant inflammatory change elsewhere in the neck.  These results were called by telephone at the time of interpretation on 12/02/2013 at 8:21 am to [facility] who verbally acknowledged these results.   Electronically Signed   By: David  JMartinique  On: 12/02/2013 08:22   Review of Systems  Constitutional: Negative for fever and chills.  HENT: Positive for sore throat, trouble swallowing and voice change. Negative for drooling and hearing loss.  Eyes: Negative for visual disturbance.  Respiratory: Negative for shortness of breath, wheezing and stridor.  Cardiovascular: Negative for chest pain.  Gastrointestinal: Negative for nausea, vomiting, abdominal pain and diarrhea.  Genitourinary: Negative for dysuria.  Musculoskeletal: Negative for arthralgias.  Skin: Negative for rash.  Allergic/Immunologic: Negative for immunocompromised state.  Neurological: Negative for  dizziness, syncope, weakness and numbness.  Psychiatric/Behavioral: Negative.    Blood pressure 123/61, pulse 80, temperature 98.1 F (36.7 C), temperature source Oral, resp. rate 12, height 5' 4"  (1.626 m), weight 245 lb (111.131 kg), last menstrual period 11/09/2013, SpO2 100.00%.  Physical Exam  Constitutional: She is oriented to person, place, and time. She appears well-developed and well-nourished. No distress.  HENT:  Head: Normocephalic and atraumatic.  Right Ear: Tympanic membrane normal.  Left Ear: Tympanic membrane normal.  Mouth/Throat: No trismus. No uvula swelling. Posterior oropharyngeal edema, Right oropharyngeal erythema and tonsillar abscesses present. No stridor noted  Neck: Normal range  of motion. Tracheal tenderness present.  Cardiovascular: Normal rate, regular rhythm, S1 normal and S2 normal.  Pulmonary/Chest: Effort normal and breath sounds normal. No accessory muscle usage or stridor. Not tachypneic. No respiratory distress.  Abdominal: Normal appearance. There is no tenderness.  Neurological: She is alert and oriented to person, place, and time. She has normal strength. No cranial nerve deficit or sensory deficit.  Skin: She is not diaphoretic.  Psychiatric: She has a normal mood and affect.   Procedure: Incision and drainage of the right peritonsillar abscess.   Anesthesia: local anesthesia with 1% lidocaine with epinephrine.   Description: The patient is placed upright on the hospital bed.  After adequate local anesthesia is achieved, an 18-G spinal needle is used to make multiple passes over the right superior tonsillar pole.  Approximately 6 cc of purulent fluid was evacuated.  The abscess cavity was incised opened with the spinal needle tip.  The patient tolerated the procedure well.    Assessment/Plan: Right PTA. I&D performed in the ER under local anesthesia. May d/c home on clindamycin.  The importance of taking the abx is discussed with the patient.  Pt  may f/u at my office in 1 week if still symptomatic.    Ariv Penrod,SUI W 12/02/2013, 11:40 AM

## 2013-12-02 NOTE — ED Notes (Signed)
Patient arrives with complaint of "my throat is swelling again". States she was seen recently, had fluid drained from peritonsillar abscess, and failed to obtain antibiotics to treat infection. Presents tonight because swelling is worse and throat hurts. Visualization of throat reveals tonsils that are nearly touching.

## 2013-12-02 NOTE — Discharge Planning (Signed)
[facility] Community Liaison  Spoke to patient regarding primary care resources and establishing care with a provider. Patient states she currently is  established with a pcp and to her knowledge has medicaid. Liaison instructed pt to contact her case worker about current medicaid benefits. Resource guide and my contact information provided for any future questions or concerns.

## 2013-12-08 NOTE — Progress Notes (Signed)
  CARE MANAGEMENT ED NOTE 12/08/2013  Patient:  Katherine Lowe, Katherine Lowe   Account Number:  [id number]  Date Initiated:  12/08/2013  Documentation initiated by:  Ferdinand Cava  Subjective/Objective Assessment:   28 yo female unable to afford discharge prescription     Subjective/Objective Assessment Detail:     Action/Plan:   Patient will use GoodRx coupon to lower the cost of the discharge prescription   Action/Plan Detail:   Anticipated DC Date:       Status Recommendation to Physician:   Result of Recommendation:  Agreed    DC Planning Services  CM consult  Medication Assistance    Choice offered to / List presented to:  C-1 Patient          Status of service:  Completed, signed off  ED Comments:   ED Comments Detail:  This CM received a phone call from [facility] and she stated that the patient is unable to afford the prescribed antibiotic. This CM then called and spoke with the patient and discussed use of the GoodRx coupon which would lower the cost of the antibiotic to $8.61. The patient stated that she could afford this. This CM then discussed the recommended follow up appointment with the patient and she stated that she has been working on her Medicaid status. She stated that she was currently at DSS and they did confirm that she and her son should have active Medicaid and are currently workjing to rectify the situation. The patient stated that she would follow up with a physician once she has her Medicaid "in order". This CM explained that the coupon would be faxed to [facility]. This CM called [facility] and spoke with [employer] to discuss the fax and was informed by [place] that [facility] no longer accepts faxed coupon or any medication assistance form and that all paper work or cooupon card would have to be brought in by the customer. This Cm then contacted the patient and texted the coupon to her and provided this CM contact information and explained that she may  have to come to the ED to obtain the paper work to then take to that specific [facility]. [address], [place], in order to receive discount on medication. The patient verbalized understanding and had no other questions or concerns.

## 2016-05-29 ENCOUNTER — Emergency Department (HOSPITAL_BASED_OUTPATIENT_CLINIC_OR_DEPARTMENT_OTHER): Payer: Medicaid Other

## 2016-05-29 ENCOUNTER — Encounter (HOSPITAL_BASED_OUTPATIENT_CLINIC_OR_DEPARTMENT_OTHER): Payer: Self-pay

## 2016-05-29 DIAGNOSIS — I498 Other specified cardiac arrhythmias: Secondary | ICD-10-CM | POA: Diagnosis not present

## 2016-05-29 DIAGNOSIS — Z87891 Personal history of nicotine dependence: Secondary | ICD-10-CM | POA: Diagnosis not present

## 2016-05-29 DIAGNOSIS — D649 Anemia, unspecified: Secondary | ICD-10-CM | POA: Insufficient documentation

## 2016-05-29 DIAGNOSIS — R0789 Other chest pain: Secondary | ICD-10-CM | POA: Diagnosis present

## 2016-05-29 NOTE — ED Triage Notes (Signed)
Pt c/o worsening generalized chest and upper back pain since yesterday.  States she felt palpitations yesterday.  Pt has not taken anything for pain, smells like cigarettes

## 2016-05-30 ENCOUNTER — Emergency Department (HOSPITAL_BASED_OUTPATIENT_CLINIC_OR_DEPARTMENT_OTHER)
Admission: EM | Admit: 2016-05-30 | Discharge: 2016-05-30 | Disposition: A | Discharge status: Home / Self Care | Payer: Medicaid Other | Attending: Emergency Medicine | Admitting: Emergency Medicine

## 2016-05-30 DIAGNOSIS — R0789 Other chest pain: Secondary | ICD-10-CM

## 2016-05-30 DIAGNOSIS — I498 Other specified cardiac arrhythmias: Secondary | ICD-10-CM

## 2016-05-30 DIAGNOSIS — D649 Anemia, unspecified: Secondary | ICD-10-CM

## 2016-05-30 LAB — TROPONIN I: Troponin I: <0.03 ng/mL (ref <0.03)

## 2016-05-30 LAB — CBC
HCT: 35 % — ABNORMAL LOW (ref 36.0–46.0)
HEMOGLOBIN: 11.1 g/dL — AB (ref 12.0–15.0)
MCH: 26.1 pg (ref 26.0–34.0)
MCHC: 31.7 g/dL (ref 30.0–36.0)
MCV: 82.2 fL (ref 78.0–100.0)
PLATELETS: 278 10*3/uL (ref 150–400)
RBC: 4.26 MIL/uL (ref 3.87–5.11)
RDW: 14.6 % (ref 11.5–15.5)
WBC: 10.4 10*3/uL (ref 4.0–10.5)

## 2016-05-30 LAB — BASIC METABOLIC PANEL
ANION GAP: 5 (ref 5–15)
BUN: 9 mg/dL (ref 6–20)
CALCIUM: 8.8 mg/dL — AB (ref 8.9–10.3)
CO2: 25 mmol/L (ref 22–32)
Chloride: 108 mmol/L (ref 101–111)
Creatinine, Ser: 0.71 mg/dL (ref 0.44–1.00)
Glucose, Bld: 100 mg/dL — ABNORMAL HIGH (ref 65–99)
Potassium: 3.5 mmol/L (ref 3.5–5.1)
Sodium: 138 mmol/L (ref 135–145)

## 2016-05-30 MED ORDER — NAPROXEN 500 MG PO TABS: 500.0000 mg | ORAL_TABLET | Freq: Two times a day (BID) | ORAL | 0 refills | Status: DC

## 2016-05-30 MED ORDER — IBUPROFEN 800 MG PO TABS
800.0000 mg | ORAL_TABLET | Freq: Once | ORAL | Status: AC
  Administered 2016-05-30: 800 mg via ORAL
  Filled 2016-05-30: qty 1

## 2016-05-30 NOTE — Discharge Instructions (Signed)
Take acetaminophen (Tylenol) as needed for additional pain relief. Use ice and/or heat as needed.

## 2016-05-30 NOTE — ED Provider Notes (Signed)
[facility] Provider Note   CSN: [id number] Arrival date & time: 05/29/16  2327  By signing my name below, I, Rosario Adie, attest that this documentation has been prepared under the direction and in the presence of Dione Booze, MD. Electronically Signed: Rosario Adie, ED Scribe. 05/30/16. 12:59 AM.  History   Chief Complaint Chief Complaint  Patient presents with  . Chest Pain   The history is provided by the patient. No language interpreter was used.    HPI Comments: Katherine Lowe is a 31 y.o. female with no pertinent PMHx, who presents to the Emergency Department complaining of gradually improving, 6/10 generalized chest pain beginning yesterday afternoon. She describes her pain as aching and notes associated sensation of palpitations. Pt was at work during the onset of her pain and she initially attributed it to anxiety. She was asymptomatic prior to the onset of her pain. Her pain is mildly exacerbated with deep inspirations. No h/o similar pain. No treatments for her pain were tried prior to coming into the ED. Pt is a current, everyday smoker at less than one ppd. No Hx of PE/DVT, recent long travel, surgery, fracture, prolonged immobilization, hormone use. Pt has f/u w/ her PCP next week for a routine physical exam. She denies shortness of breath, cough, fever, chills, nausea, vomiting, or any other associated symptoms.  PCP:  Arther Abbott, MD  History reviewed. No pertinent past medical history.  There are no active problems to display for this patient.  Past Surgical History:  Procedure Laterality Date  . APPENDECTOMY     OB History    No data available     Home Medications    Prior to Admission medications   Medication Sig Start Date End Date Taking? Authorizing Provider  acetaminophen (TYLENOL) 325 MG tablet Take 650 mg by mouth every 6 (six) hours as needed for mild pain.    Historical Provider, MD  clindamycin (CLEOCIN) 150 MG capsule  Take 2 capsules (300 mg total) by mouth 3 (three) times daily. 11/22/13   Blane Ohara, MD  clindamycin (CLEOCIN) 150 MG capsule Take 1 capsule (150 mg total) by mouth every 6 (six) hours. 12/02/13   Rolland Porter, MD  HYDROcodone-acetaminophen (NORCO/VICODIN) 5-325 MG per tablet Take 1 tablet by mouth every 4 (four) hours as needed. 12/02/13   Rolland Porter, MD  naproxen sodium (ANAPROX) 220 MG tablet Take 220 mg by mouth every 8 (eight) hours as needed (pain).    Historical Provider, MD   Family History No family history on file.  Social History Social History  Substance Use Topics  . Smoking status: Former Games developer  . Smokeless tobacco: Not on file  . Alcohol use No   Allergies   Patient has no known allergies.  Review of Systems Review of Systems  Constitutional: Negative for chills and fever.  Respiratory: Negative for cough and shortness of breath.   Cardiovascular: Positive for chest pain and palpitations.  Gastrointestinal: Negative for nausea and vomiting.  All other systems reviewed and are negative.  Physical Exam Updated Vital Signs BP 166/97 (BP Location: Left Arm)   Pulse 78   Temp 98 F (36.7 C) (Oral)   Resp 18   Ht 5\' 5"  (1.651 m)   Wt 265 lb (120.2 kg)   LMP 05/22/2016   SpO2 100%   BMI 44.10 kg/m   Physical Exam  Constitutional: She is oriented to person, place, and time. She appears well-developed and well-nourished.  HENT:  Head:  Normocephalic and atraumatic.  Eyes: EOM are normal. Pupils are equal, round, and reactive to light.  Neck: Normal range of motion. Neck supple. No JVD present.  Cardiovascular: Normal rate.   Murmur heard. Irregular rhythm. 1-2/6 systolic ejection murmur best heard at the cardiac base.  Pulmonary/Chest: Effort normal and breath sounds normal. She has no wheezes. She has no rales. She exhibits tenderness.  Mild tenderness bilateral parasternal.   Abdominal: Soft. Bowel sounds are normal. She exhibits no distension and no mass.  There is no tenderness.  Musculoskeletal: Normal range of motion. She exhibits no edema.  Lymphadenopathy:    She has no cervical adenopathy.  Neurological: She is alert and oriented to person, place, and time. No cranial nerve deficit. She exhibits normal muscle tone. Coordination normal.  Skin: Skin is warm and dry. No rash noted.  Psychiatric: She has a normal mood and affect. Her behavior is normal. Judgment and thought content normal.  Nursing note and vitals reviewed.  ED Treatments / Results  DIAGNOSTIC STUDIES: Oxygen Saturation is 100% on RA, normal by my interpretation.   COORDINATION OF CARE: 12:59 AM-Discussed next steps with pt. Pt verbalized understanding and is agreeable with the plan.   Labs (all labs ordered are listed, but only abnormal results are displayed) Labs Reviewed  BASIC METABOLIC PANEL - Abnormal; Notable for the following:       Result Value   Glucose, Bld 100 (*)    Calcium 8.8 (*)    All other components within normal limits  CBC - Abnormal; Notable for the following:    Hemoglobin 11.1 (*)    HCT 35.0 (*)    All other components within normal limits  TROPONIN I   EKG  EKG Interpretation  Date/Time:  Monday May 29 2016 23:35:24 EST Ventricular Rate:  70 PR Interval:  192 QRS Duration: 82 QT Interval:  378 QTC Calculation: 408 R Axis:   57 Text Interpretation:  Normal sinus rhythm with sinus arrhythmia Cannot rule out Anterior infarct , age undetermined Abnormal ECG No old tracing to compare Confirmed by [facility]  MD, Auriella Wieand ([id number]) on 05/29/2016 11:41:19 PM      Radiology Dg Chest 2 View  Result Date: 05/29/2016 CLINICAL DATA:  Generalized chest and upper back pain, body aches for 1 week. EXAM: CHEST  2 VIEW COMPARISON:  None. FINDINGS: Cardiomediastinal silhouette is normal. No pleural effusions or focal consolidations. Trachea projects midline and there is no pneumothorax. Soft tissue planes and included osseous structures are  non-suspicious. IMPRESSION: Normal chest. Electronically Signed   By: Awilda Metro M.D.   On: 05/29/2016 23:53   Procedures Procedures   Medications Ordered in ED Medications  ibuprofen (ADVIL,MOTRIN) tablet 800 mg (not administered)    Initial Impression / Assessment and Plan / ED Course  I have reviewed the triage vital signs and the nursing notes.  Pertinent labs & imaging results that were available during my care of the patient were reviewed by me and considered in my medical decision making (see chart for details).  Chest wall pain. No red flags to suggest more serious pathology. Chest x-ray and ECG are unremarkable. She does have significant sinus arrhythmia noted on cardiac monitor and on ECG and this probably accounts for her sense of her heart fluttering. Old records are reviewed, and she has no relevant past visits. She is given a dose of ibuprofen in the ED and discharged with prescription for naproxen. Return precautions discussed.  Final Clinical Impressions(s) / ED Diagnoses  Final diagnoses:  Chest wall pain  Normochromic normocytic anemia  Sinus arrhythmia   New Prescriptions New Prescriptions   NAPROXEN (NAPROSYN) 500 MG TABLET    Take 1 tablet (500 mg total) by mouth 2 (two) times daily.   I personally performed the services described in this documentation, which was scribed in my presence. The recorded information has been reviewed and is accurate.      Dione Boozeavid Zaela Graley, MD 05/30/16 [phone]

## 2017-03-06 ENCOUNTER — Encounter (HOSPITAL_BASED_OUTPATIENT_CLINIC_OR_DEPARTMENT_OTHER): Payer: Self-pay

## 2017-03-06 ENCOUNTER — Emergency Department (HOSPITAL_BASED_OUTPATIENT_CLINIC_OR_DEPARTMENT_OTHER)
Admission: EM | Admit: 2017-03-06 | Discharge: 2017-03-06 | Discharge status: Against Medical Advice | Payer: Medicaid Other | Attending: Emergency Medicine | Admitting: Emergency Medicine

## 2017-03-06 ENCOUNTER — Other Ambulatory Visit: Payer: Self-pay

## 2017-03-06 DIAGNOSIS — K0889 Other specified disorders of teeth and supporting structures: Secondary | ICD-10-CM | POA: Diagnosis present

## 2017-03-06 DIAGNOSIS — Z5321 Procedure and treatment not carried out due to patient leaving prior to being seen by health care provider: Secondary | ICD-10-CM | POA: Diagnosis not present

## 2017-03-06 HISTORY — DX: Anemia, unspecified: D64.9

## 2017-03-06 MED ORDER — ACETAMINOPHEN 325 MG PO TABS
650.0000 mg | ORAL_TABLET | Freq: Once | ORAL | Status: AC
  Administered 2017-03-06: 650 mg via ORAL
  Filled 2017-03-06: qty 2

## 2017-03-06 NOTE — ED Triage Notes (Addendum)
Left lower dental pain with left ear and left facial and head pain for a week that is not relieved by 800mg  ibuprofen, last dose of ibuprofen was at 2000

## 2017-03-06 NOTE — ED Notes (Signed)
Visitor at desk asking about the wait time, informed that there were still several people ahead of her.

## 2017-03-06 NOTE — ED Notes (Signed)
Pt and visitor seen leaving by staff

## 2019-02-17 ENCOUNTER — Other Ambulatory Visit: Payer: Self-pay

## 2019-02-17 ENCOUNTER — Emergency Department (HOSPITAL_BASED_OUTPATIENT_CLINIC_OR_DEPARTMENT_OTHER): Payer: Medicaid Other

## 2019-02-17 ENCOUNTER — Emergency Department (HOSPITAL_BASED_OUTPATIENT_CLINIC_OR_DEPARTMENT_OTHER)
Admission: EM | Admit: 2019-02-17 | Discharge: 2019-02-17 | Disposition: A | Discharge status: Home / Self Care | Payer: Medicaid Other | Attending: Emergency Medicine | Admitting: Emergency Medicine

## 2019-02-17 ENCOUNTER — Encounter (HOSPITAL_BASED_OUTPATIENT_CLINIC_OR_DEPARTMENT_OTHER): Payer: Self-pay | Admitting: Emergency Medicine

## 2019-02-17 DIAGNOSIS — R1030 Lower abdominal pain, unspecified: Secondary | ICD-10-CM

## 2019-02-17 DIAGNOSIS — N760 Acute vaginitis: Secondary | ICD-10-CM | POA: Insufficient documentation

## 2019-02-17 DIAGNOSIS — R102 Pelvic and perineal pain: Secondary | ICD-10-CM | POA: Diagnosis not present

## 2019-02-17 DIAGNOSIS — R1084 Generalized abdominal pain: Secondary | ICD-10-CM | POA: Diagnosis present

## 2019-02-17 DIAGNOSIS — B9689 Other specified bacterial agents as the cause of diseases classified elsewhere: Secondary | ICD-10-CM

## 2019-02-17 HISTORY — DX: Obesity, unspecified: E66.9

## 2019-02-17 LAB — URINALYSIS, ROUTINE W REFLEX MICROSCOPIC
Bilirubin Urine: NEGATIVE
Glucose, UA: NEGATIVE mg/dL
Ketones, ur: NEGATIVE mg/dL
Nitrite: NEGATIVE
Protein, ur: NEGATIVE mg/dL
Specific Gravity, Urine: 1.015 (ref 1.005–1.030)
pH: 7.5 (ref 5.0–8.0)

## 2019-02-17 LAB — WET PREP, GENITAL
Sperm: NONE SEEN
Trich, Wet Prep: NONE SEEN
Yeast Wet Prep HPF POC: NONE SEEN

## 2019-02-17 LAB — COMPREHENSIVE METABOLIC PANEL
ALT: 14 U/L (ref 0–44)
AST: 13 U/L — ABNORMAL LOW (ref 15–41)
Albumin: 3.6 g/dL (ref 3.5–5.0)
Alkaline Phosphatase: 76 U/L (ref 38–126)
Anion gap: 8 (ref 5–15)
BUN: 9 mg/dL (ref 6–20)
CO2: 25 mmol/L (ref 22–32)
Calcium: 8.9 mg/dL (ref 8.9–10.3)
Chloride: 108 mmol/L (ref 98–111)
Creatinine, Ser: 0.65 mg/dL (ref 0.44–1.00)
GFR calc Af Amer: >60 mL/min (ref >60)
GFR calc non Af Amer: >60 mL/min (ref >60)
Glucose, Bld: 90 mg/dL (ref 70–99)
Potassium: 4.1 mmol/L (ref 3.5–5.1)
Sodium: 141 mmol/L (ref 135–145)
Total Bilirubin: 0.5 mg/dL (ref 0.3–1.2)
Total Protein: 8 g/dL (ref 6.5–8.1)

## 2019-02-17 LAB — CBC WITH DIFFERENTIAL/PLATELET
Abs Immature Granulocytes: 0.02 10*3/uL (ref 0.00–0.07)
Basophils Absolute: 0 10*3/uL (ref 0.0–0.1)
Basophils Relative: 0 %
Eosinophils Absolute: 0.2 10*3/uL (ref 0.0–0.5)
Eosinophils Relative: 2 %
HCT: 37.4 % (ref 36.0–46.0)
Hemoglobin: 11.6 g/dL — ABNORMAL LOW (ref 12.0–15.0)
Immature Granulocytes: 0 %
Lymphocytes Relative: 23 %
Lymphs Abs: 2.4 10*3/uL (ref 0.7–4.0)
MCH: 26.9 pg (ref 26.0–34.0)
MCHC: 31 g/dL (ref 30.0–36.0)
MCV: 86.6 fL (ref 80.0–100.0)
Monocytes Absolute: 0.6 10*3/uL (ref 0.1–1.0)
Monocytes Relative: 5 %
Neutro Abs: 7.3 10*3/uL (ref 1.7–7.7)
Neutrophils Relative %: 70 %
Platelets: 298 10*3/uL (ref 150–400)
RBC: 4.32 MIL/uL (ref 3.87–5.11)
RDW: 14.1 % (ref 11.5–15.5)
WBC: 10.5 10*3/uL (ref 4.0–10.5)
nRBC: 0 % (ref 0.0–0.2)

## 2019-02-17 LAB — PREGNANCY, URINE: Preg Test, Ur: NEGATIVE

## 2019-02-17 LAB — LIPASE, BLOOD: Lipase: 23 U/L (ref 11–51)

## 2019-02-17 LAB — URINALYSIS, MICROSCOPIC (REFLEX)

## 2019-02-17 MED ORDER — METRONIDAZOLE 500 MG PO TABS: 500.0000 mg | ORAL_TABLET | Freq: Two times a day (BID) | ORAL | 0 refills | Status: AC

## 2019-02-17 MED ORDER — DICYCLOMINE HCL 10 MG PO CAPS
ORAL_CAPSULE | ORAL | Status: AC
  Administered 2019-02-17: 11:00:00 20 mg
  Filled 2019-02-17: qty 2

## 2019-02-17 MED ORDER — DICYCLOMINE HCL 20 MG PO TABS: 20.0000 mg | ORAL_TABLET | Freq: Three times a day (TID) | ORAL | 0 refills | Status: DC | PRN

## 2019-02-17 MED ORDER — DICYCLOMINE HCL 20 MG PO TABS
20.0000 mg | ORAL_TABLET | Freq: Once | ORAL | Status: DC
  Filled 2019-02-17: qty 1

## 2019-02-17 MED ORDER — IOHEXOL 300 MG/ML  SOLN
100.0000 mL | Freq: Once | INTRAMUSCULAR | Status: AC | PRN
  Administered 2019-02-17: 100 mL via INTRAVENOUS

## 2019-02-17 MED ORDER — KETOROLAC TROMETHAMINE 10 MG PO TABS: 10.0000 mg | ORAL_TABLET | Freq: Four times a day (QID) | ORAL | 0 refills | Status: DC | PRN

## 2019-02-17 MED ORDER — KETOROLAC TROMETHAMINE 30 MG/ML IJ SOLN
30.0000 mg | Freq: Once | INTRAMUSCULAR | Status: AC
  Administered 2019-02-17: 30 mg via INTRAVENOUS
  Filled 2019-02-17: qty 1

## 2019-02-17 MED FILL — METRONIDAZOLE 500 MG TABS: 500 | 7 days supply | Qty: 14 | Fill #0

## 2019-02-17 MED FILL — DICYCLOMINE 20 MG TABLET: 20 | 10 days supply | Qty: 30 | Fill #0

## 2019-02-17 MED FILL — KETOROLAC 10 MG TABLET: 10 | 5 days supply | Qty: 20 | Fill #0

## 2019-02-17 NOTE — ED Notes (Signed)
Patient transported to CT 

## 2019-02-17 NOTE — Discharge Instructions (Addendum)
You were seen for abdominal pain in the ED. You got lab work and imaging to work up the source of your pain.   Your blood work and urine were normal.   We found bacterial vaginosis on pelvic exam. We are prescribing you flagyl for 7 days. Please do not drink alcohol while taking flagyl.   Imaging of your abdomen was unremarkable.   We treated you with two different medications for pain and will send you with prescriptions for both.   We are still awaiting some results and will call you if those require treatment.

## 2019-02-17 NOTE — ED Notes (Signed)
ED Provider at bedside. 

## 2019-02-17 NOTE — ED Notes (Signed)
Patient transported to Ultrasound 

## 2019-02-17 NOTE — ED Triage Notes (Signed)
Low abd pain and low back pain x3 days. Also urinating more than usual.

## 2019-02-17 NOTE — ED Provider Notes (Signed)
[facility] EMERGENCY DEPARTMENT Provider Note   CSN: [id number] Arrival date & time: 02/17/19  0907     History   Chief Complaint Chief Complaint  Patient presents with   Abdominal Pain    HPI Katherine Lowe is a 33 y.o. female.     Katherine Lowe is a 33 yo F without significant medical hx who presents with 2 days of severe, sudden onset abdominal cramping and lower back pain. She reports the pain as constant and cramping in quality. She reports she had to lay on the ground at work it was so painful. She initially thought she had to have a BM but that did not relieve her sx. She reports similar pain with appendicitis previously but reports that she had her appendix removed. She denies change in her bowel movements including constipation and diarrhea. She denies nausea, vomiting and has been able to eat regularly. She reports some discomfort after she urinates that is not exactly pain but hard to describe other than uncomfortable. She reports increased urinary frequency but no urgency. She denies vaginal discharge. She reports she is sexually active and uses protection and she also has an IUD and does not believe she could be pregnant. She also has some lower back pain. She denies fever, weight loss, mouth sores, chest pain, SOB, flank pain, light headedness and rash. She has no other complaints or concerns.      Past Medical History:  Diagnosis Date   Anemia    Obesity   No medical problems   There are no active problems to display for this patient.   Past Surgical History:  Procedure Laterality Date   APPENDECTOMY     CESAREAN SECTION      OB History   No obstetric history on file.    4 children   Home Medications    Prior to Admission medications   Medication Sig Start Date End Date Taking? Authorizing Provider  dicyclomine (BENTYL) 20 MG tablet Take 1 tablet (20 mg total) by mouth 3 (three) times daily as needed for spasms. 02/17/19 02/17/20  Jenell Milliner, MD  ketorolac (TORADOL) 10 MG tablet Take 1 tablet (10 mg total) by mouth every 6 (six) hours as needed. 02/17/19   Jenell Milliner, MD  metroNIDAZOLE (FLAGYL) 500 MG tablet Take 1 tablet (500 mg total) by mouth 2 (two) times daily for 7 days. 02/17/19 02/24/19  Jenell Milliner, MD   Family History No family history on file.  Social History Social History   Tobacco Use   Smoking status: Former Smoker   Smokeless tobacco: Never Used  Substance Use Topics   Alcohol use: No   Drug use: Never  Patient sexually active, 3-4 weeks ago  Allergies   Patient has no known allergies.  Review of Systems Review of Systems  Constitutional: Negative for fever and unexpected weight change.  HENT: Negative for mouth sores.   Respiratory: Negative for shortness of breath.   Cardiovascular: Negative for chest pain and palpitations.  Gastrointestinal: Positive for abdominal pain. Negative for abdominal distention, constipation, diarrhea, nausea and vomiting.  Endocrine: Negative for polydipsia.  Genitourinary: Positive for dysuria and frequency. Negative for urgency and vaginal discharge.  Musculoskeletal: Positive for back pain.  Skin: Negative for rash.  Neurological: Negative for light-headedness.  Psychiatric/Behavioral:       No increased anxiety or depression    Physical Exam Updated Vital Signs BP 139/88 (BP Location: Left Arm)    Pulse (!) 57  Temp 98.8 F (37.1 C) (Oral)    Resp 16    Ht  (1.651 m)    Wt (!) 137.8 kg    SpO2 99%    BMI 50.57 kg/m   Physical Exam Vitals signs and nursing note reviewed.  Constitutional:      General: She is not in acute distress.    Appearance: She is well-developed. She is obese.  HENT:     Head: Normocephalic and atraumatic.  Eyes:     General: No scleral icterus. Cardiovascular:     Rate and Rhythm: Normal rate and regular rhythm.     Heart sounds: Normal heart sounds. No murmur.  Pulmonary:     Effort: Pulmonary effort is  normal. No respiratory distress.     Breath sounds: Normal breath sounds.  Abdominal:     General: Bowel sounds are normal. There is no distension.     Palpations: Abdomen is soft. There is no mass.     Tenderness: There is abdominal tenderness (tenderness worse on RLQ than LLQ). There is no right CVA tenderness, left CVA tenderness or rebound.     Hernia: No hernia is present.  Genitourinary:    Vagina: Normal. No vaginal discharge or bleeding.     Cervix: Cervical motion tenderness present. No discharge.     Adnexa:        Right: No tenderness or fullness.         Left: No tenderness or fullness.    Musculoskeletal: Normal range of motion.     Lumbar back: She exhibits tenderness (mid and right lower back TTP).  Skin:    General: Skin is warm and dry.  Neurological:     General: No focal deficit present.     Mental Status: She is alert and oriented to person, place, and time.  Psychiatric:        Mood and Affect: Mood normal. Mood is not anxious.    ED Treatments / Results  Labs (all labs ordered are listed, but only abnormal results are displayed) Labs Reviewed  WET PREP, GENITAL - Abnormal; Notable for the following components:      Result Value   Clue Cells Wet Prep HPF POC PRESENT (*)    WBC, Wet Prep HPF POC MANY (*)    All other components within normal limits  URINALYSIS, ROUTINE W REFLEX MICROSCOPIC - Abnormal; Notable for the following components:   Hgb urine dipstick TRACE (*)    Leukocytes,Ua TRACE (*)    All other components within normal limits  URINALYSIS, MICROSCOPIC (REFLEX) - Abnormal; Notable for the following components:   Bacteria, UA RARE (*)    All other components within normal limits  CBC WITH DIFFERENTIAL/PLATELET - Abnormal; Notable for the following components:   Hemoglobin 11.6 (*)    All other components within normal limits  COMPREHENSIVE METABOLIC PANEL - Abnormal; Notable for the following components:   AST 13 (*)    All other components  within normal limits  PREGNANCY, URINE  LIPASE, BLOOD  GC/CHLAMYDIA PROBE AMP ([place]) NOT AT [facility]   EKG None  Radiology US Transvaginal Non-ob  Result Date: 02/17/2019 CLINICAL DATA:  Pelvic pain EXAM: TRANSABDOMINAL AND TRANSVAGINAL ULTRASOUND OF PELVIS TECHNIQUE: Study was performed transabdominally to optimize pelvic field of view evaluation and transvaginally to optimize internal visceral architecture evaluation. COMPARISON:  None FINDINGS: Uterus Measurements: 10.2 x 4.5 x 4.9 cm = volume: 116 mL. No fibroids or other mass visualized. Endometrium Thickness: 3 mm.  Intrauterine device positioned within the endometrium. Endometrial contour appears smooth. Right ovary Unable to visualize by either transabdominal or transvaginal technique. No right-sided pelvic mass evident. Left ovary Unable to visualize by either transabdominal or transvaginal technique. No left-sided pelvic mass evident. Other findings No abnormal free fluid. IMPRESSION: 1. Neither ovary could be visualized by either transabdominal or transvaginal technique. No extrauterine pelvic mass or free fluid evident on this study. 2.  No intrauterine mass. 3.  Intrauterine device positioned within the endometrium. Electronically Signed   By: Lowella Grip III M.D.   On: 02/17/2019 11:28   US Pelvis Complete  Result Date: 02/17/2019 CLINICAL DATA:  Pelvic pain EXAM: TRANSABDOMINAL AND TRANSVAGINAL ULTRASOUND OF PELVIS TECHNIQUE: Study was performed transabdominally to optimize pelvic field of view evaluation and transvaginally to optimize internal visceral architecture evaluation. COMPARISON:  None FINDINGS: Uterus Measurements: 10.2 x 4.5 x 4.9 cm = volume: 116 mL. No fibroids or other mass visualized. Endometrium Thickness: 3 mm. Intrauterine device positioned within the endometrium. Endometrial contour appears smooth. Right ovary Unable to visualize by either transabdominal or transvaginal technique. No right-sided pelvic mass  evident. Left ovary Unable to visualize by either transabdominal or transvaginal technique. No left-sided pelvic mass evident. Other findings No abnormal free fluid. IMPRESSION: 1. Neither ovary could be visualized by either transabdominal or transvaginal technique. No extrauterine pelvic mass or free fluid evident on this study. 2.  No intrauterine mass. 3.  Intrauterine device positioned within the endometrium. Electronically Signed   By: Lowella Grip III M.D.   On: 02/17/2019 11:28   Ct Abdomen Pelvis W Contrast  Result Date: 02/17/2019 CLINICAL DATA:  Lower abdominal and back pain for the past 3 days. EXAM: CT ABDOMEN AND PELVIS WITH CONTRAST TECHNIQUE: Multidetector CT imaging of the abdomen and pelvis was performed using the standard protocol following bolus administration of intravenous contrast. CONTRAST:  168mL OMNIPAQUE IOHEXOL 300 MG/ML  SOLN COMPARISON:  10/08/2004 FINDINGS: Lower chest: Unremarkable. Hepatobiliary: No focal liver abnormality is seen. No gallstones, gallbladder wall thickening, or biliary dilatation. Pancreas: Unremarkable. No pancreatic ductal dilatation or surrounding inflammatory changes. Spleen: Normal in size without focal abnormality. Adrenals/Urinary Tract: 3 mm lower pole left renal calculus. Otherwise, normal appearing adrenal glands, kidneys, ureters and urinary bladder. No ureteral calculi or hydronephrosis. Stomach/Bowel: Unremarkable stomach, small bowel and colon. Surgically absent appendix. Vascular/Lymphatic: No significant vascular findings are present. No enlarged abdominal or pelvic lymph nodes. Reproductive: Intrauterine device in expected position in the central uterus. No adnexal mass. Other: Midline surgical scar. Small umbilical hernia containing fat. Musculoskeletal: Mild lumbar and lower thoracic spine degenerative changes. IMPRESSION: 1. No acute abnormality. 2. 3 mm nonobstructing lower pole left renal calculus. Electronically Signed   By: Claudie Revering M.D.   On: 02/17/2019 12:22    Procedures Procedures (including critical care time)  Medications Ordered in ED Medications  dicyclomine (BENTYL) tablet 20 mg ( Oral Canceled Entry 02/17/19 1228)  dicyclomine (BENTYL) 10 MG capsule (20 mg  Given 02/17/19 1118)  ketorolac (TORADOL) 30 MG/ML injection 30 mg (30 mg Intravenous Given 02/17/19 1229)  iohexol (OMNIPAQUE) 300 MG/ML solution 100 mL (100 mLs Intravenous Contrast Given 02/17/19 1204)   Initial Impression / Assessment and Plan / ED Course  I have reviewed the triage vital signs and the nursing notes.  Pertinent labs & imaging results that were available during my care of the patient were reviewed by me and considered in my medical decision making (see chart for details).  Patient presents with 2 days of sudden onset, constant abdominal cramping and urinary frequency with TTP in RLQ and suprapubic areas without guarding on exam, right lower back pain TTP on exam and cervical motion tenderness on pelvic exam whose picture is concerning for ovarian torsion, PID, cystitis, gas or constipation. Appendicitis not possible as patient has had an appendectomy. Ectopic pregnancy unlikely as patient has an IUD. Will obtain UA, pregnancy test, wet prep, GC/Ghlamydia, blood work, transabdominal US, transvaginal US. Will treat cramping with bentyl in the meantime.   UA, pregnancy test, lipase, CBC and CMP unremarkable.  Wet prep demonstrated clue cells concerning for BV.  US without visualizaiton of ovaries so CT abdomen ordered. Pain unimproved with bentyl so patient given toradol given for pain.  CT abdomen with unremarkable ovaries. Pain improved with toradol.    Final Clinical Impressions(s) / ED Diagnoses   Given unremarkable UA, bloodwork and imaging, this lower abdominal pain may be secondary to gas or constipation. No clear reason for her pain at this time.   Patient did have bacterial vaginosis per wet prep and is being  treated with 7 days of flagyl.   Prescriptions for bentyl and toradol sent for cramping pain.   STD testing pending and if positive, will treat for PID given CMT.  Final diagnoses:  Lower abdominal pain  BV (bacterial vaginosis)    ED Discharge Orders         Ordered    dicyclomine (BENTYL) 20 MG tablet  3 times daily PRN     02/17/19 1322    ketorolac (TORADOL) 10 MG tablet  Every 6 hours PRN     02/17/19 1322    metroNIDAZOLE (FLAGYL) 500 MG tablet  2 times daily     02/17/19 1323           Jenell MillinerLanier, Cashmere Harmes, MD 02/17/19 1359    Maia PlanLong, Joshua G, MD 02/17/19 1925

## 2019-02-18 LAB — GC/CHLAMYDIA PROBE AMP (~~LOC~~) NOT AT ARMC
Chlamydia: NEGATIVE
Neisseria Gonorrhea: NEGATIVE

## 2019-09-04 ENCOUNTER — Encounter (HOSPITAL_BASED_OUTPATIENT_CLINIC_OR_DEPARTMENT_OTHER): Payer: Self-pay | Admitting: Emergency Medicine

## 2019-09-04 ENCOUNTER — Other Ambulatory Visit: Payer: Self-pay

## 2019-09-04 ENCOUNTER — Emergency Department (HOSPITAL_BASED_OUTPATIENT_CLINIC_OR_DEPARTMENT_OTHER)
Admission: EM | Admit: 2019-09-04 | Discharge: 2019-09-04 | Disposition: A | Discharge status: Home / Self Care | Payer: Medicaid Other | Attending: Emergency Medicine | Admitting: Emergency Medicine

## 2019-09-04 DIAGNOSIS — R519 Headache, unspecified: Secondary | ICD-10-CM | POA: Insufficient documentation

## 2019-09-04 DIAGNOSIS — Z87891 Personal history of nicotine dependence: Secondary | ICD-10-CM | POA: Diagnosis not present

## 2019-09-04 MED ORDER — PROCHLORPERAZINE EDISYLATE 10 MG/2ML IJ SOLN
10.0000 mg | Freq: Once | INTRAMUSCULAR | Status: AC
  Administered 2019-09-04: 10 mg via INTRAVENOUS
  Filled 2019-09-04: qty 2

## 2019-09-04 MED ORDER — DEXAMETHASONE SODIUM PHOSPHATE 10 MG/ML IJ SOLN
10.0000 mg | Freq: Once | INTRAMUSCULAR | Status: AC
  Administered 2019-09-04: 10 mg via INTRAVENOUS
  Filled 2019-09-04: qty 1

## 2019-09-04 MED ORDER — SODIUM CHLORIDE 0.9 % IV BOLUS
1000.0000 mL | Freq: Once | INTRAVENOUS | Status: AC
  Administered 2019-09-04: 1000 mL via INTRAVENOUS

## 2019-09-04 MED ORDER — DIPHENHYDRAMINE HCL 50 MG/ML IJ SOLN
25.0000 mg | Freq: Once | INTRAMUSCULAR | Status: AC
  Administered 2019-09-04: 25 mg via INTRAVENOUS
  Filled 2019-09-04: qty 1

## 2019-09-04 NOTE — ED Triage Notes (Signed)
PT c/o HA since Sunday; not relieved by Tylenol or ibuprofen

## 2019-09-04 NOTE — ED Notes (Signed)
Pt to bathroom.  Urine cup given,  Pt unable to get specimen as she had to go badly.

## 2019-09-04 NOTE — ED Provider Notes (Signed)
[facility] EMERGENCY DEPARTMENT Provider Note   CSN: [id number] Arrival date & time: 09/04/19  4709     History Chief Complaint  Patient presents with  . Headache    Katherine Lowe is a 34 y.o. female.  HPI      Presents with concern for headache Began on Sunday, began slowly, took something and laid down Took tylenol extra strength and ibuprofen which helped some but it came back Headache currently 8/10, can get to a 10 Sharp pain to top and radiates to back, left sided Pressure on the area makes it worse Not worse with loud sounds, is worse with bright lights No falls or trauma No nausea or vomiting No numbness/weakness/change in vision (irritation when in sun), no difficulty walking or talking   No fevers, no congestion No history of headaches Aunt died of an aneurysm Upset today as she just received news that her cousin had died, possible drug related death  Past Medical History:  Diagnosis Date  . Anemia   . Obesity     There are no problems to display for this patient.   Past Surgical History:  Procedure Laterality Date  . APPENDECTOMY    . CESAREAN SECTION       OB History   No obstetric history on file.     No family history on file.  Social History   Tobacco Use  . Smoking status: Former Games developer  . Smokeless tobacco: Never Used  Substance Use Topics  . Alcohol use: No  . Drug use: Never    Home Medications Prior to Admission medications   Medication Sig Start Date End Date Taking? Authorizing Provider  dicyclomine (BENTYL) 20 MG tablet Take 1 tablet (20 mg total) by mouth 3 (three) times daily as needed for spasms. 02/17/19 02/17/20  Jenell Milliner, MD  ketorolac (TORADOL) 10 MG tablet Take 1 tablet (10 mg total) by mouth every 6 (six) hours as needed. 02/17/19   Jenell Milliner, MD    Allergies    Patient has no known allergies.  Review of Systems   Review of Systems  Constitutional: Negative for fever.  HENT: Negative  for sore throat.   Eyes: Negative for visual disturbance.  Respiratory: Negative for cough and shortness of breath.   Cardiovascular: Negative for chest pain.  Gastrointestinal: Negative for abdominal pain, nausea and vomiting.  Genitourinary: Negative for difficulty urinating.  Musculoskeletal: Negative for back pain and neck pain.  Skin: Negative for rash.  Neurological: Positive for headaches. Negative for dizziness, syncope, facial asymmetry, speech difficulty, weakness and numbness.    Physical Exam Updated Vital Signs BP (!) 157/98   Pulse 72   Temp 98.2 F (36.8 C) (Oral)   Resp 18   Ht 5\' 5"  (1.651 m)   Wt (!) 137.9 kg   SpO2 100%   BMI 50.59 kg/m   Physical Exam Vitals and nursing note reviewed.  Constitutional:      General: She is not in acute distress.    Appearance: Normal appearance. She is not ill-appearing, toxic-appearing or diaphoretic.  HENT:     Head: Normocephalic and atraumatic.  Eyes:     General: No visual field deficit.    Extraocular Movements: Extraocular movements intact.     Conjunctiva/sclera: Conjunctivae normal.     Pupils: Pupils are equal, round, and reactive to light.  Cardiovascular:     Rate and Rhythm: Normal rate and regular rhythm.     Pulses: Normal pulses.  Pulmonary:  Effort: Pulmonary effort is normal. No respiratory distress.  Musculoskeletal:        General: No swelling, tenderness, deformity or signs of injury.     Cervical back: Normal range of motion. No rigidity.  Skin:    General: Skin is warm and dry.     Coloration: Skin is not jaundiced or pale.     Findings: No erythema or rash.  Neurological:     General: No focal deficit present.     Mental Status: She is alert and oriented to person, place, and time.     GCS: GCS eye subscore is 4. GCS verbal subscore is 5. GCS motor subscore is 6.     Cranial Nerves: No cranial nerve deficit, dysarthria or facial asymmetry.     Sensory: No sensory deficit.     Motor:  No weakness or tremor.     Coordination: Coordination normal. Finger-Nose-Finger Test normal.     Gait: Gait normal.     ED Results / Procedures / Treatments   Labs (all labs ordered are listed, but only abnormal results are displayed) Labs Reviewed  PREGNANCY, URINE    EKG None  Radiology No results found.  Procedures Procedures (including critical care time)  Medications Ordered in ED Medications  prochlorperazine (COMPAZINE) injection 10 mg (10 mg Intravenous Given 09/04/19 1113)  diphenhydrAMINE (BENADRYL) injection 25 mg (25 mg Intravenous Given 09/04/19 1110)  sodium chloride 0.9 % bolus 1,000 mL (1,000 mLs Intravenous New Bag/Given 09/04/19 1110)  dexamethasone (DECADRON) injection 10 mg (10 mg Intravenous Given 09/04/19 1119)    ED Course  I have reviewed the triage vital signs and the nursing notes.  Pertinent labs & imaging results that were available during my care of the patient were reviewed by me and considered in my medical decision making (see chart for details).    MDM Rules/Calculators/A&P                       Very pleasant 34yo female presents with concern of headache.  Headache began slowly, no trauma, no fevers, and normal neurologic exam and have low suspicion for [facility], SDH or meningitis. She does have aunt with history of aneurysm and recommend follow up with PCP, but symptoms today not consistent with SAH.  She  was given compazine, decadron, and benadryl with improvement in headache.  Patient discharged in stable condition with understanding of reasons to return.     Final Clinical Impression(s) / ED Diagnoses Final diagnoses:  Acute nonintractable headache, unspecified headache type    Rx / DC Orders ED Discharge Orders    None       Gareth Morgan, MD 09/04/19 2155

## 2019-09-04 NOTE — ED Notes (Signed)
Pt sts she would like to go home now; HA resolved; EDP notified.

## 2019-09-04 NOTE — ED Notes (Signed)
Pt reports she is upset d/t news she received this morning r/t death of cousin.

## 2020-02-10 ENCOUNTER — Encounter (HOSPITAL_BASED_OUTPATIENT_CLINIC_OR_DEPARTMENT_OTHER): Payer: Self-pay | Admitting: Emergency Medicine

## 2020-02-10 ENCOUNTER — Emergency Department (HOSPITAL_BASED_OUTPATIENT_CLINIC_OR_DEPARTMENT_OTHER)
Admission: EM | Admit: 2020-02-10 | Discharge: 2020-02-10 | Disposition: A | Discharge status: Home / Self Care | Payer: Medicaid Other | Attending: Emergency Medicine | Admitting: Emergency Medicine

## 2020-02-10 ENCOUNTER — Other Ambulatory Visit: Payer: Self-pay

## 2020-02-10 DIAGNOSIS — K0889 Other specified disorders of teeth and supporting structures: Secondary | ICD-10-CM | POA: Diagnosis present

## 2020-02-10 DIAGNOSIS — Z87891 Personal history of nicotine dependence: Secondary | ICD-10-CM | POA: Insufficient documentation

## 2020-02-10 MED ORDER — CHLORHEXIDINE GLUCONATE 0.12 % MT SOLN: 15.0000 mL | Freq: Two times a day (BID) | OROMUCOSAL | 0 refills | Status: DC

## 2020-02-10 MED ORDER — PENICILLIN V POTASSIUM 500 MG PO TABS: 500.0000 mg | ORAL_TABLET | Freq: Four times a day (QID) | ORAL | 0 refills | Status: AC

## 2020-02-10 MED ORDER — KETOROLAC TROMETHAMINE 60 MG/2ML IM SOLN
60.0000 mg | Freq: Once | INTRAMUSCULAR | Status: AC
  Administered 2020-02-10: 60 mg via INTRAMUSCULAR
  Filled 2020-02-10: qty 2

## 2020-02-10 MED ORDER — CHLORHEXIDINE GLUCONATE 0.12 % MT SOLN
15.0000 mL | Freq: Once | OROMUCOSAL | Status: DC
  Filled 2020-02-10: qty 15

## 2020-02-10 NOTE — ED Triage Notes (Signed)
Left teeth pain , unsure if lower or upper , since this Am. Some swelling noted .

## 2020-02-10 NOTE — ED Provider Notes (Signed)
[facility] EMERGENCY DEPARTMENT Provider Note   CSN: [id number] Arrival date & time: 02/10/20  1816     History Chief Complaint  Patient presents with  . Dental Pain    Katherine Lowe is a 34 y.o. female.  HPI 34 year old female with history of anemia and obesity who resents with 1 day of dental pain.  Patient states she has a history of dental caries and to start to develop left-sided dental pain.  Unclear if this is upper or lower.  Denies any fevers, chills, drooling, trismus, swelling, voice change. She is followed by [facility]     Past Medical History:  Diagnosis Date  . Anemia   . Obesity     There are no problems to display for this patient.   Past Surgical History:  Procedure Laterality Date  . APPENDECTOMY    . CESAREAN SECTION       OB History   No obstetric history on file.     No family history on file.  Social History   Tobacco Use  . Smoking status: Former Games developer  . Smokeless tobacco: Never Used  Substance Use Topics  . Alcohol use: No  . Drug use: Never    Home Medications Prior to Admission medications   Medication Sig Start Date End Date Taking? Authorizing Provider  chlorhexidine (PERIDEX) 0.12 % solution Use as directed 15 mLs in the mouth or throat 2 (two) times daily. 02/10/20   Mare Ferrari, PA-C  dicyclomine (BENTYL) 20 MG tablet Take 1 tablet (20 mg total) by mouth 3 (three) times daily as needed for spasms. 02/17/19 02/17/20  Jenell Milliner, MD  ketorolac (TORADOL) 10 MG tablet Take 1 tablet (10 mg total) by mouth every 6 (six) hours as needed. 02/17/19   Jenell Milliner, MD  penicillin v potassium (VEETID) 500 MG tablet Take 1 tablet (500 mg total) by mouth 4 (four) times daily for 7 days. 02/10/20 02/17/20  Mare Ferrari, PA-C    Allergies    Patient has no known allergies.  Review of Systems   Review of Systems  HENT: Positive for dental problem. Negative for drooling, ear pain, facial swelling, hearing  loss, mouth sores, postnasal drip, sinus pressure, sinus pain, sore throat, tinnitus, trouble swallowing and voice change.     Physical Exam Updated Vital Signs BP (!) 154/114   Pulse 87   Temp 98 F (36.7 C) (Oral)   Resp 18   SpO2 99%   Physical Exam Vitals reviewed.  Constitutional:      Appearance: Normal appearance.  HENT:     Head: Normocephalic and atraumatic.     Mouth/Throat:      Comments: Oropharynx non erythematous without exudates, uvula midline, no unilateral tonsillar swelling, tongue normal size and midline, no sublingual/submandibular swellimg, tolerating secretions well.  Some erythema to the gumline posteriorly to the left lower most back molar.  No obvious swelling, fluctuance.   Eyes:     General:        Right eye: No discharge.        Left eye: No discharge.     Extraocular Movements: Extraocular movements intact.     Conjunctiva/sclera: Conjunctivae normal.  Musculoskeletal:        General: No swelling. Normal range of motion.  Neurological:     General: No focal deficit present.     Mental Status: She is alert and oriented to person, place, and time.  Psychiatric:  Mood and Affect: Mood normal.        Behavior: Behavior normal.     ED Results / Procedures / Treatments   Labs (all labs ordered are listed, but only abnormal results are displayed) Labs Reviewed - No data to display  EKG None  Radiology No results found.  Procedures Procedures (including critical care time)  Medications Ordered in ED Medications  ketorolac (TORADOL) injection 60 mg (has no administration in time range)  chlorhexidine (PERIDEX) 0.12 % solution 15 mL (has no administration in time range)    ED Course  I have reviewed the triage vital signs and the nursing notes.  Pertinent labs & imaging results that were available during my care of the patient were reviewed by me and considered in my medical decision making (see chart for details).    MDM  Rules/Calculators/A&P                          Patient with toothache.  No gross abscess.  Exam unconcerning for Ludwig's angina or spread of infection.  Will treat with penicillin and anti-inflammatories medicine.  Urged patient to follow-up with dentist.  Return precautions discussed.  Patient voiced understanding is agreeable.  At this stage in the ED course, the patient is medically screened and stable for discharge.  Final Clinical Impression(s) / ED Diagnoses Final diagnoses:  Pain, dental    Rx / DC Orders ED Discharge Orders         Ordered    penicillin v potassium (VEETID) 500 MG tablet  4 times daily        02/10/20 1838    chlorhexidine (PERIDEX) 0.12 % solution  2 times daily        02/10/20 1838           Leone Brand 02/10/20 1845    Charlynne Pander, MD 02/10/20 [phone]

## 2020-02-10 NOTE — ED Notes (Signed)
DC instructions provided to client, assisted client in finding a dentist off near her residence in [place]. Also informed pt of the two Rx's sent electronically to her pharmacy of choice. Stressed the importance of taking and completing all of abx prescribed, also discussed the importance of making a Network engineer. Opportunity for questions provided.

## 2020-02-10 NOTE — Discharge Instructions (Addendum)
Please take the antibiotic as directed until finished.  You may also use the mouthwash to help relieve pain.  He may take Tylenol or ibuprofen for pain as well.  Please make sure to call [facility]  to schedule an appointment with them for your dental pain.  Return to the ER if you have any worsening drooling, fevers, chills, and ability to swallow, difficulty opening your mouth etc.

## 2020-02-10 NOTE — ED Notes (Signed)
Toradol IM was given, prior to leaving, pt states she thinks she is now feeling some better, nml speech noted, appeared in less pain than upon arrival to ED.

## 2020-03-11 IMAGING — US US PELVIS COMPLETE
1 series · 14 of 25 positions shown · non-contrast
Comparison: None

CLINICAL DATA: Pelvic pain

EXAM:
TRANSABDOMINAL AND TRANSVAGINAL ULTRASOUND OF PELVIS
TECHNIQUE: Study was performed transabdominally to optimize pelvic field of
view evaluation and transvaginally to optimize internal visceral
architecture evaluation.

[Series 1: us pelvis complete · 14 of 71 slices shown]
[im 1/71]
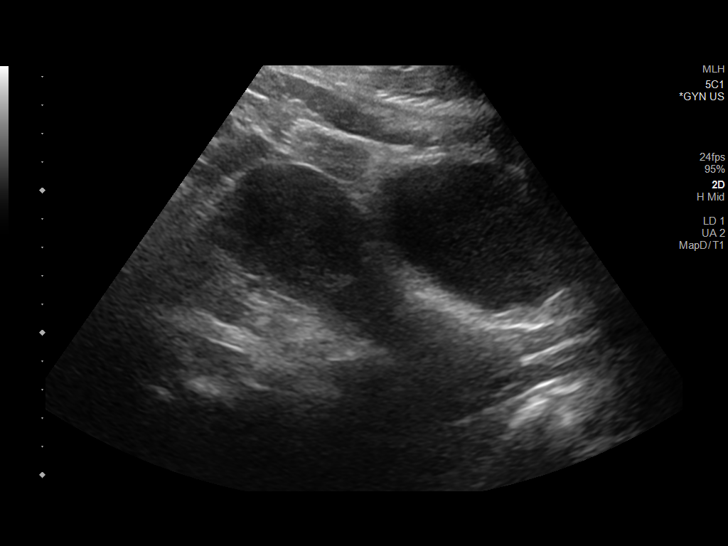
[im 6/71]
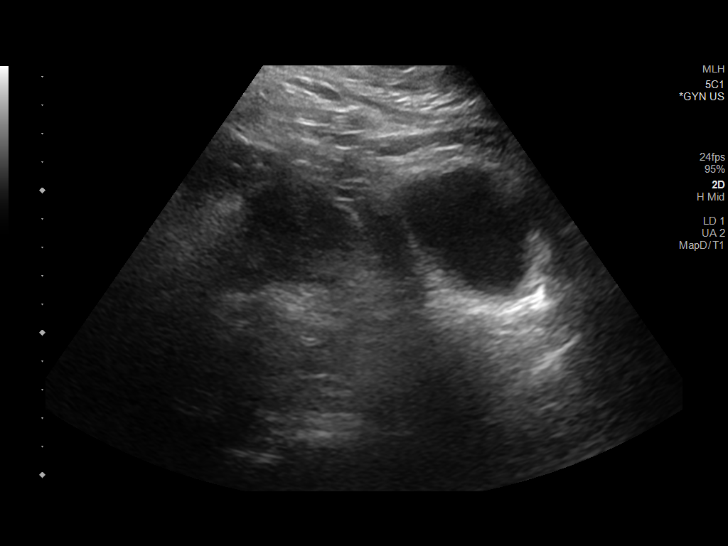
[im 12/71]
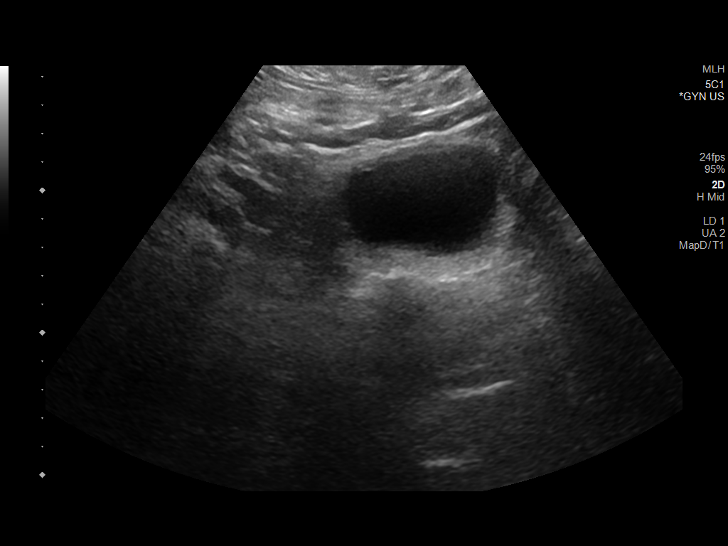
[im 18/71]
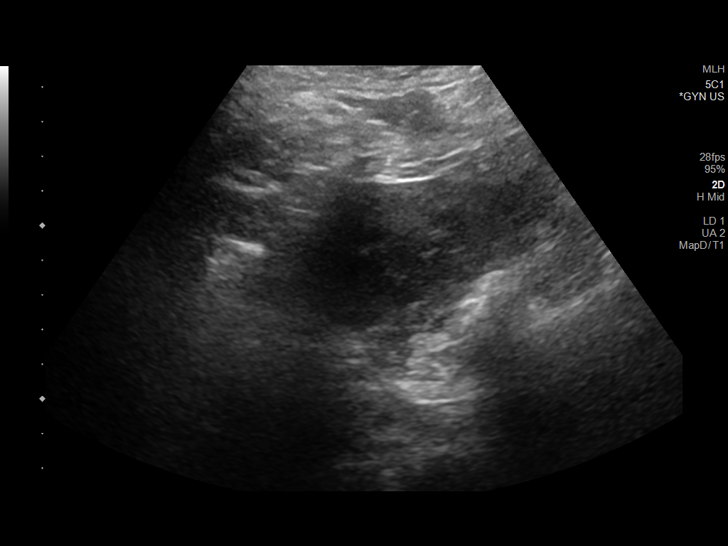
[im 24/71]
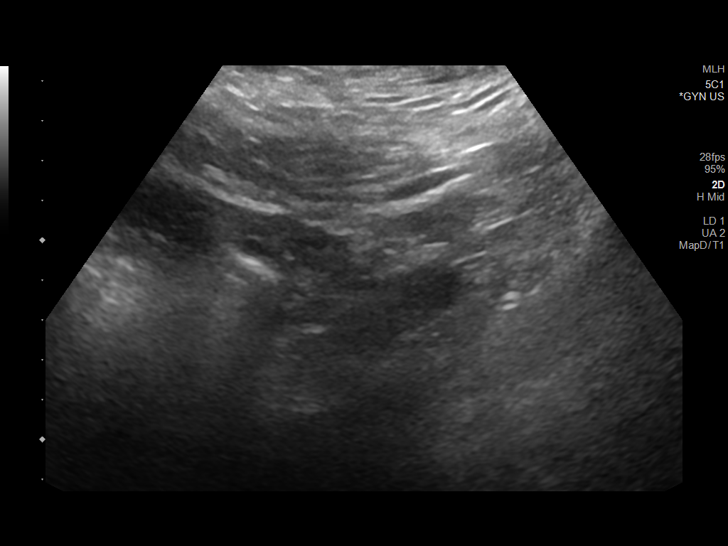
[im 27/71]
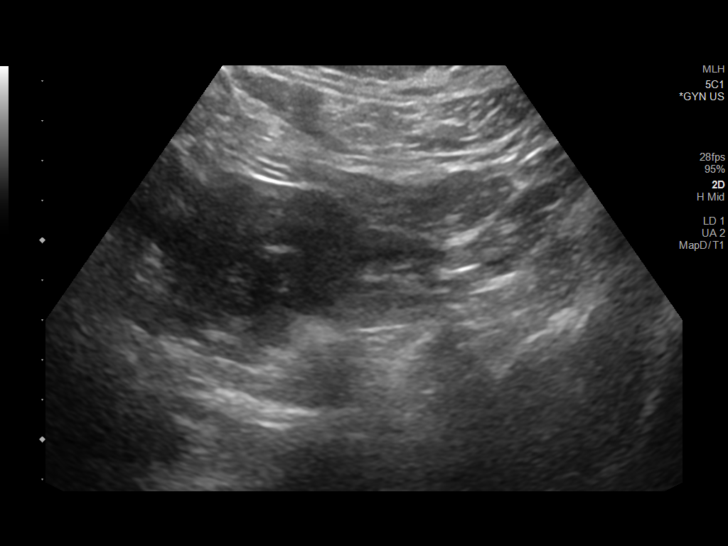
[im 33/71]
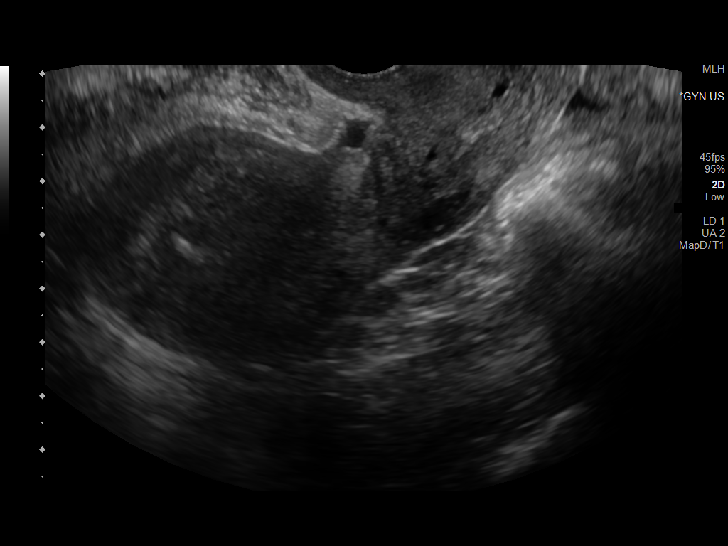
[im 38/71]
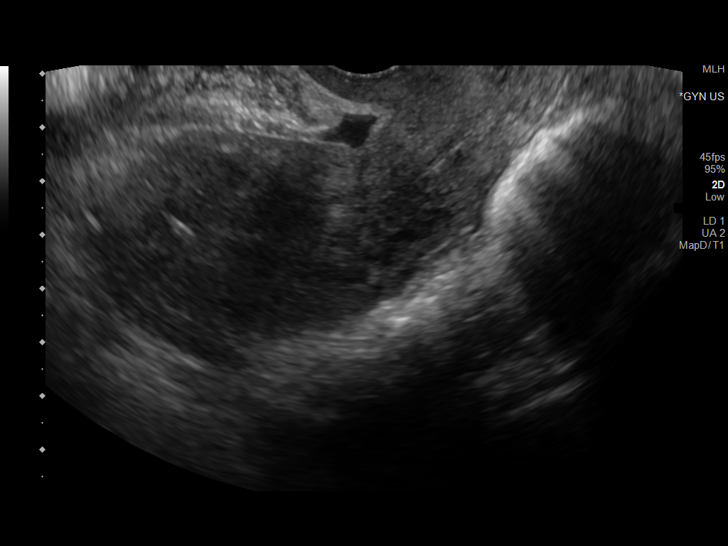
[im 44/71]
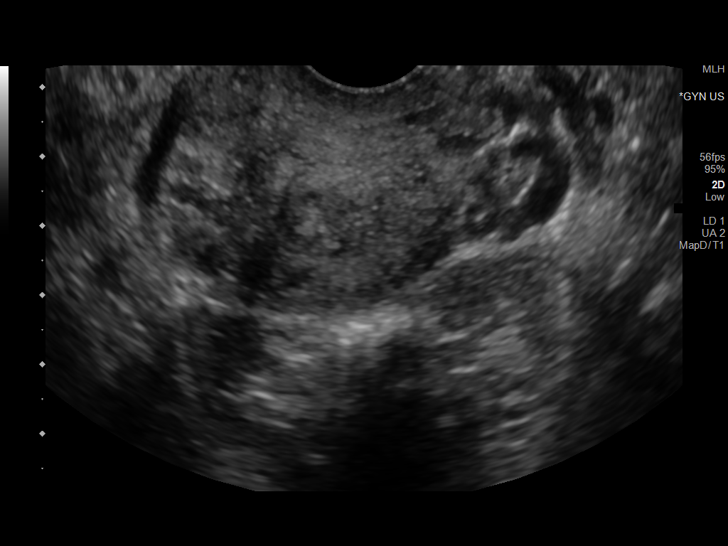
[im 47/71]
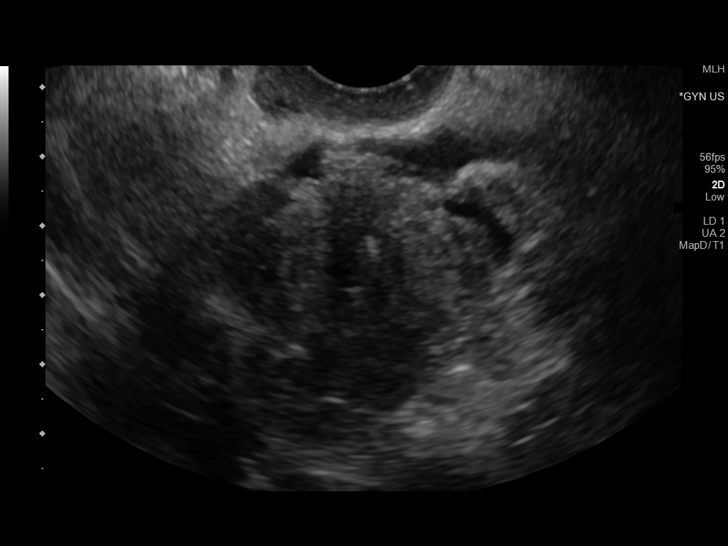
[im 53/71]
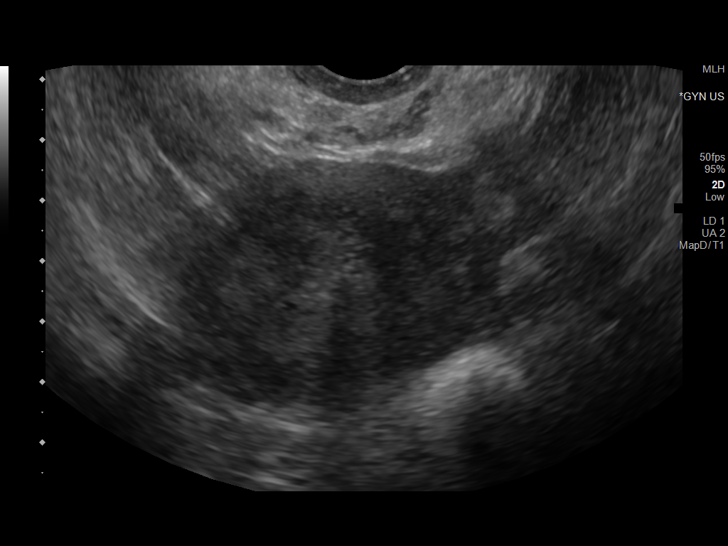
[im 59/71]
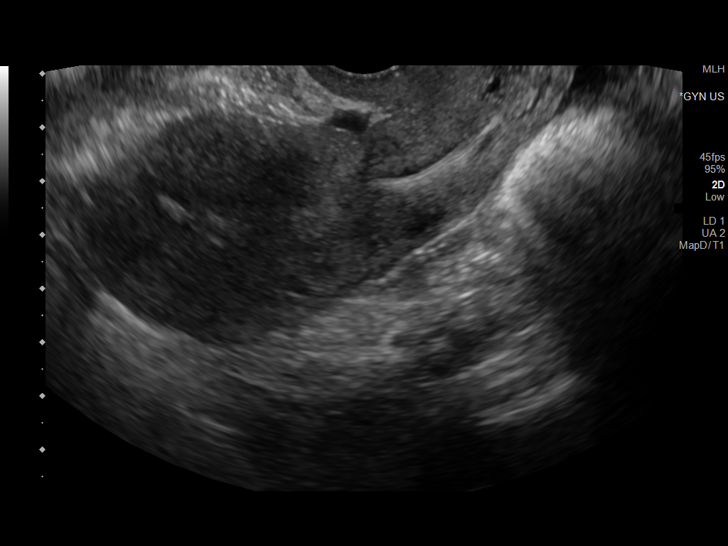
[im 65/71]
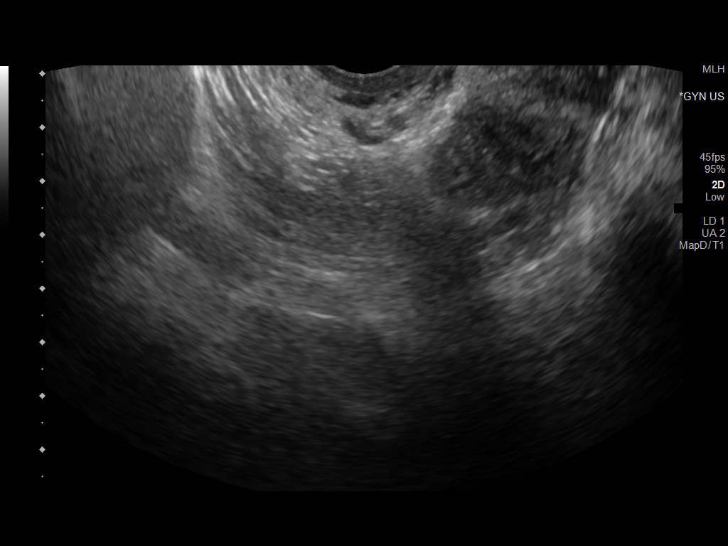
[im 71/71]
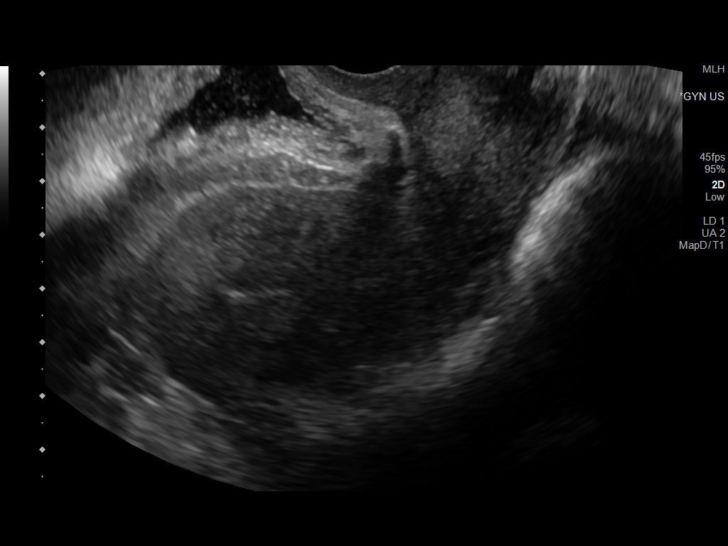

[14 of 25 positions shown; findings below may reference images not displayed]

FINDINGS: Uterus

Measurements: 10.2 x 4.5 x 4.9 cm = volume: 116 mL. No fibroids or
other mass visualized.

Endometrium

Thickness: 3 mm. Intrauterine device positioned within the
endometrium. Endometrial contour appears smooth.

Right ovary

Unable to visualize by either transabdominal or transvaginal
technique. No right-sided pelvic mass evident.

Left ovary

Unable to visualize by either transabdominal or transvaginal
technique. No left-sided pelvic mass evident.

Other findings

No abnormal free fluid.
IMPRESSION: 1. Neither ovary could be visualized by either transabdominal or
transvaginal technique. No extrauterine pelvic mass or free fluid
evident on this study.

2.  No intrauterine mass.

3.  Intrauterine device positioned within the endometrium.

## 2020-06-29 ENCOUNTER — Other Ambulatory Visit: Payer: Self-pay

## 2020-06-29 ENCOUNTER — Inpatient Hospital Stay (HOSPITAL_COMMUNITY)
Admission: AD | Admit: 2020-06-29 | Discharge: 2020-06-29 | Disposition: A | Discharge status: Home / Self Care | Payer: Medicaid Other | Attending: Obstetrics and Gynecology | Admitting: Obstetrics and Gynecology

## 2020-06-29 DIAGNOSIS — Z3201 Encounter for pregnancy test, result positive: Secondary | ICD-10-CM

## 2020-06-29 DIAGNOSIS — Z711 Person with feared health complaint in whom no diagnosis is made: Secondary | ICD-10-CM

## 2020-06-29 NOTE — Discharge Instructions (Signed)
Home Pregnancy Test Information  Why am I having this test? A home pregnancy test helps you determine whether you are pregnant or not. There are several types of home pregnancy tests that can be bought at a store or pharmacy. Home pregnancy tests are very accurate when:  You are at least 3-[redacted] weeks pregnant.  It has been 1-2 weeks since your missed period.  You use the test according to the package instructions. What is being tested? A home pregnancy test detects the presence of a hormone called human chorionic gonadotropin (hCG) in your urine. HCG is produced by cells of the placenta. The placenta is the organ that forms to nourish and support a developing baby. What kind of sample is taken? Home pregnancy tests require a urine sample. How do I collect samples at home? Most kits use a plastic testing device with a strip of paper that indicates whether there is hCG in your urine. Follow the test package instructions very carefully for how to test your urine. Depending on the test, you may need to:  Urinate directly onto the stick.  Urinate into a cup. Wait for the results as directed by the package instructions. The amount of time may be different for each type of test. How do I prepare for this test? For best results, collect the sample the first time you urinate in the morning. This when the concentration of hCG is highest. How are the results reported? Follow the test package instructions for how to read your test results. Depending on the test, results may be displayed as:  A plus or a minus sign.  One or two lines.  "Pregnant" or "not pregnant." What do the results mean? Positive pregnancy test result A positive home pregnancy test means that you are pregnant. It is important to schedule an appointment with your health care provider to start prenatal care. Your health care provider may perform additional testing to confirm the pregnancy and to determine that you have a normal  pregnancy. Negative pregnancy test result If you have a negative home pregnancy test you may want to wait a few days and then repeat the home pregnancy test. Many kits contain a second test as a back-up. If you have a negative test and also have symptoms of pregnancy, contact your health care provider. Your health care provider can test a sample of your blood to check for pregnancy. A blood test may return a positive result even if a urine test was negative because blood tests are more accurate. This means blood tests can detect hCG earlier than urine tests. Talk with your health care provider about what your results mean. Some things to know about a home pregnancy test Sometimes, a home pregnancy test may report that you are pregnant when you are not pregnant (false-positive result). This can happen if you:  Are taking certain medicines, such as: ? Medicine to control seizures. ? Anti-anxiety medicine. ? Fertility medicine with hCG.  Have a medical condition that affects your hormone levels.  Had a recent pregnancy loss (miscarriage) or abortion. Sometimes, a home pregnancy test may report that you are not pregnant when you are pregnant (false-negative result). This can happen if you:  Take the test too early in your pregnancy. Before 3-4 weeks of pregnancy, there may not be enough hCG to detect.  Drink a lot of liquid before the test.  Use an expired pregnancy test.  Are taking certain medicines, such as antihistamines or water pills (diuretics). Questions to  ask your health care provider Ask your health care provider, or the department that is doing the test:  When will my results be ready?  How will I get my results?  What are my treatment options?  What other tests do I need?  What are my next steps? Summary  A home pregnancy test helps you determine whether you are pregnant or not by detecting the presence of the hormone human chorionic gonadotropin (hCG) in a sample of  your urine.  Follow the test package instructions very carefully. For best results, collect the sample the first time you urinate in the morning. This when the concentration of hCG is highest.  Home pregnancy tests are very accurate when you are 3-[redacted] weeks pregnant or when it has been 1-2 weeks since your missed period.  A positive home pregnancy test means that you are pregnant. It is important to schedule an appointment with your health care provider to start prenatal care. This information is not intended to replace advice given to you by your health care provider. Make sure you discuss any questions you have with your health care provider. Document Revised: 01/05/2020 Document Reviewed: 01/05/2020 Elsevier Patient Education  2021 [employer]    
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
